# Patient Record
Sex: Female | Born: 1955 | Race: White | Hispanic: No | Smoking: Former smoker
Health system: Southern US, Community
[De-identification: ages and names within clinical notes are randomized; demographics above are authoritative.]

## PROBLEM LIST (undated history)

## (undated) DIAGNOSIS — C3490 Malignant neoplasm of unspecified part of unspecified bronchus or lung: Secondary | ICD-10-CM

## (undated) DIAGNOSIS — J449 Chronic obstructive pulmonary disease, unspecified: Secondary | ICD-10-CM

## (undated) DIAGNOSIS — K219 Gastro-esophageal reflux disease without esophagitis: Secondary | ICD-10-CM

## (undated) DIAGNOSIS — G709 Myoneural disorder, unspecified: Secondary | ICD-10-CM

## (undated) DIAGNOSIS — H04129 Dry eye syndrome of unspecified lacrimal gland: Secondary | ICD-10-CM

## (undated) DIAGNOSIS — I10 Essential (primary) hypertension: Secondary | ICD-10-CM

## (undated) DIAGNOSIS — T7840XA Allergy, unspecified, initial encounter: Secondary | ICD-10-CM

## (undated) DIAGNOSIS — J45909 Unspecified asthma, uncomplicated: Secondary | ICD-10-CM

## (undated) DIAGNOSIS — Z5189 Encounter for other specified aftercare: Secondary | ICD-10-CM

## (undated) HISTORY — DX: Allergy, unspecified, initial encounter: T78.40XA

## (undated) HISTORY — DX: Dry eye syndrome of unspecified lacrimal gland: H04.129

## (undated) HISTORY — DX: Gastro-esophageal reflux disease without esophagitis: K21.9

## (undated) HISTORY — DX: Encounter for other specified aftercare: Z51.89

## (undated) HISTORY — PX: HERNIA REPAIR: SHX51

## (undated) HISTORY — DX: Unspecified asthma, uncomplicated: J45.909

## (undated) HISTORY — PX: APPENDECTOMY: SHX54

## (undated) HISTORY — DX: Chronic obstructive pulmonary disease, unspecified: J44.9

## (undated) HISTORY — DX: Myoneural disorder, unspecified: G70.9

---

## 1995-07-27 HISTORY — PX: SMALL INTESTINE SURGERY: SHX150

## 1998-07-26 HISTORY — PX: CERVICAL FUSION: SHX112

## 2004-07-03 ENCOUNTER — Other Ambulatory Visit: Admission: RE | Admit: 2004-07-03 | Discharge: 2004-07-03 | Payer: Self-pay | Admitting: Family Medicine

## 2005-10-13 ENCOUNTER — Encounter: Admission: RE | Admit: 2005-10-13 | Discharge: 2005-10-13 | Payer: Self-pay | Admitting: Family Medicine

## 2006-01-03 ENCOUNTER — Other Ambulatory Visit: Admission: RE | Admit: 2006-01-03 | Discharge: 2006-01-03 | Payer: Self-pay | Admitting: Family Medicine

## 2007-01-16 ENCOUNTER — Other Ambulatory Visit: Admission: RE | Admit: 2007-01-16 | Discharge: 2007-01-16 | Payer: Self-pay | Admitting: Family Medicine

## 2008-03-08 ENCOUNTER — Other Ambulatory Visit: Admission: RE | Admit: 2008-03-08 | Discharge: 2008-03-08 | Payer: Self-pay | Admitting: Family Medicine

## 2008-04-22 ENCOUNTER — Other Ambulatory Visit: Admission: RE | Admit: 2008-04-22 | Discharge: 2008-04-22 | Payer: Self-pay | Admitting: Family Medicine

## 2009-03-10 ENCOUNTER — Other Ambulatory Visit: Admission: RE | Admit: 2009-03-10 | Discharge: 2009-03-10 | Payer: Self-pay | Admitting: Family Medicine

## 2009-07-26 DIAGNOSIS — C3490 Malignant neoplasm of unspecified part of unspecified bronchus or lung: Secondary | ICD-10-CM

## 2009-07-26 HISTORY — PX: PNEUMONECTOMY: SHX168

## 2009-07-26 HISTORY — DX: Malignant neoplasm of unspecified part of unspecified bronchus or lung: C34.90

## 2010-01-01 ENCOUNTER — Other Ambulatory Visit: Admission: RE | Admit: 2010-01-01 | Discharge: 2010-01-01 | Payer: Self-pay | Admitting: Obstetrics and Gynecology

## 2010-01-19 ENCOUNTER — Encounter: Admission: RE | Admit: 2010-01-19 | Discharge: 2010-01-19 | Payer: Self-pay | Admitting: Family Medicine

## 2010-01-29 ENCOUNTER — Ambulatory Visit: Payer: Self-pay | Admitting: Cardiothoracic Surgery

## 2010-02-02 ENCOUNTER — Encounter: Admission: RE | Admit: 2010-02-02 | Discharge: 2010-02-02 | Payer: Self-pay | Admitting: Cardiothoracic Surgery

## 2010-02-04 ENCOUNTER — Ambulatory Visit (HOSPITAL_COMMUNITY): Admission: RE | Admit: 2010-02-04 | Discharge: 2010-02-04 | Payer: Self-pay | Admitting: Cardiothoracic Surgery

## 2010-02-05 ENCOUNTER — Encounter: Payer: Self-pay | Admitting: Internal Medicine

## 2010-02-05 ENCOUNTER — Ambulatory Visit: Payer: Self-pay | Admitting: Internal Medicine

## 2010-02-05 DIAGNOSIS — N83209 Unspecified ovarian cyst, unspecified side: Secondary | ICD-10-CM | POA: Insufficient documentation

## 2010-02-05 DIAGNOSIS — I10 Essential (primary) hypertension: Secondary | ICD-10-CM | POA: Insufficient documentation

## 2010-02-05 DIAGNOSIS — J449 Chronic obstructive pulmonary disease, unspecified: Secondary | ICD-10-CM | POA: Insufficient documentation

## 2010-02-05 DIAGNOSIS — J984 Other disorders of lung: Secondary | ICD-10-CM | POA: Insufficient documentation

## 2010-02-05 DIAGNOSIS — J4489 Other specified chronic obstructive pulmonary disease: Secondary | ICD-10-CM | POA: Insufficient documentation

## 2010-02-05 DIAGNOSIS — I671 Cerebral aneurysm, nonruptured: Secondary | ICD-10-CM | POA: Insufficient documentation

## 2010-02-12 ENCOUNTER — Ambulatory Visit: Payer: Self-pay | Admitting: Cardiothoracic Surgery

## 2010-04-07 ENCOUNTER — Encounter: Admission: RE | Admit: 2010-04-07 | Discharge: 2010-04-07 | Payer: Self-pay | Admitting: Family Medicine

## 2010-04-15 ENCOUNTER — Encounter: Admission: RE | Admit: 2010-04-15 | Discharge: 2010-04-15 | Payer: Self-pay | Admitting: Diagnostic Neuroimaging

## 2010-07-26 HISTORY — PX: BRAIN SURGERY: SHX531

## 2010-08-15 ENCOUNTER — Encounter: Payer: Self-pay | Admitting: Family Medicine

## 2010-08-25 NOTE — Assessment & Plan Note (Signed)
Summary: cancel    Copy to:  Dr Tyrone Sage Primary Provider/Referring Provider:   Dr. Docia Chuck, [place], [place].    History of Present Illness: did my note in the preload part. see that  Allergies: 1)  ! Codeine 2)  ! Hydrocodone 3)  ! * Latex

## 2010-10-11 LAB — BLOOD GAS, ARTERIAL
Acid-Base Excess: 3 mmol/L — ABNORMAL HIGH (ref 0.0–2.0)
Bicarbonate: 26.4 mEq/L — ABNORMAL HIGH (ref 20.0–24.0)
Drawn by: 211791
FIO2: 0.21 %
O2 Saturation: 94.9 %
Patient temperature: 98.6
TCO2: 23 mmol/L (ref 0–100)
pCO2 arterial: 37.7 mmHg (ref 35.0–45.0)
pH, Arterial: 7.459 — ABNORMAL HIGH (ref 7.350–7.400)
pO2, Arterial: 70.5 mmHg — ABNORMAL LOW (ref 80.0–100.0)

## 2010-10-11 LAB — GLUCOSE, CAPILLARY: Glucose-Capillary: 100 mg/dL — ABNORMAL HIGH (ref 70–99)

## 2010-10-22 ENCOUNTER — Other Ambulatory Visit: Payer: Self-pay | Admitting: Dermatology

## 2010-12-08 NOTE — Consult Note (Signed)
NEW PATIENT CONSULTATION   Claire Hamilton, Claire Hamilton  DOB:  06/16/1956                                        January 29, 2010  CHART #:  [id number]   PRIMARY CARE PHYSICIAN:  Dr. Docia Chuck, [place], [place].   REASON FOR CONSULTATION:  Right lung mass.   HISTORY OF PRESENT ILLNESS:  The patient is a 55 year old female with  long history of smoking who fell in early June and fractured her right  clavicle.  [facility], which was  thought to be abnormal, though we do not have a copy of this film.  Because of this, she was referred to [facility] for a CT scan of  the chest.  This revealed a spiculated mass in the right upper lobe, 18  x 28 x 20 mm, suspicious for primary lung cancer with central  cavitation.  There was prominent precarinal mediastinal adenopathy.  No  hilar adenopathy and a fracture of the distal clavicle.  The patient is  referred to Thoracic Surgery for further evaluation of newly found lung  mass.  The patient denies any history of hemoptysis.  She denies any  significant wheezing.  She has been a long-term smoker for over 30  years, notes that she quit last Friday.  She smoked up to a pack and  half a day.   She denies any previous cardiac history.  Denies any history of  myocardial infarction.  She does have a history of hypertension.  Denies  diabetes.  Denies hyperlipidemia.  Denies previous stroke.  Denies  claudication.  Denies renal insufficiency.  She has been told that she  has mild COPD.  We have no documented previous pulmonary function  studies.   PAST SURGICAL HISTORY:  Previous surgery includes in 1997, she had a  horseback riding injury resulting in small bowel resection.  In 1999,  she had effusion through an anterior cervical approach of the cervical  vertebrae.  She has had a history of inguinal hernia repair, history of  appendectomy and ruptured appendix.   SOCIAL HISTORY:  The patient is  married.  She is unemployed.  She notes  that she drinks on almost a daily basis.   CURRENT MEDICATIONS:  1. Ambien 10 mg a day.  2. Metoprolol 25 a day.  3. Ventolin.  4. Vitamin D.  5. Calcium.  6. Spironolactone/hydrochlorothiazide 25/25 daily.  7. Ramipril 2.5 a day.  8. Acetaminophen/tramadol 325/37.5 twice a day p.r.n.  9. Clobetasol propionate 0.05%.   ALLERGIES:  Codeine, hydrocodone, and latex.  The latex allergy involved  irritation and fatigue during the long process of secondary wound  intention with her abdominal wound injury in 1997.   FAMILY HISTORY:  Negative for history of lung cancer or family members  with early vascular disease.   REVIEW OF SYSTEMS:  CARDIAC:  The patient denies chest pain.  Denies  resting shortness of breath.  Does have exertional shortness of breath.  Denies orthopnea, presyncope, syncope, or palpitations.  GENERAL:  She has had weight loss.  She notes that she had lost from 126  down to 119.  She denies hemoptysis.  Denies change in bowel habits.  Denies blood in her stool or urine.  Denies psychiatric history.  Does  get rashes with sling that was placed on her  arm.  Denies history of  gallstones.   PHYSICAL EXAMINATION:  The patient is a thin-appearing white female, in  no distress.  Blood pressure is 133/80, pulse is 81, respiratory rate is  18, and O2 sats 93% on room air.  She is 5 feet 2 inches tall, 119  pounds.  She has no carotid bruits.  She has distant breath sounds  bilaterally, but without wheezing.  She has no palpable cervical or  supraclavicular adenopathy.  She has good strength in both arms and  legs.  She has healed abdominal wound from previous laparotomy.  She has  no calf tenderness or swelling.   DIAGNOSTIC TESTS:  CT scan is reviewed and shows approximately 2-cm  spiculated mass in the right upper lobe suspicious for carcinoma and  enlarged precarinal lymph nodes.  No further radiographic studies have  been  performed.   IMPRESSION:  The patient with a long-term history of smoking who  presents with an incidental finding of a right upper lobe lung nodule  while being evaluated for right clavicle fracture.  I have reviewed  these findings with the patient and recommended we proceed with further  radiographic evaluation and pulmonary functions.  We discussed with her  to stop smoking, which she is dedicated to.  I have also recommended  that she stop alcohol intake with anticipation of surgery.  We have made  arrangements for a PET scan to evaluate both the primary lesion, but  particularly to evaluate the precarinal lymph nodes.  She will also have  a full set of pulmonary function studies with diffusion capacity and  also will obtain an MRI of the brain to rule out metastatic disease in  the brain.  The patient will come to the [facility] Thoracic Oncology [facility]  next week to review these findings.  If her pulmonary function studies  are reasonable and there is no involvement of mediastinal nodes, then  she could be considered for wedge resection and radiation seeds or right  upper lobectomy depending on her pulmonary function with a slightly  enlarged lymph node.  If this is positive on PET scan, then she may  require mediastinoscopy or EBUS prior to consideration of lobectomy.   Sheliah Plane, MD  Electronically Signed   EG/MEDQ  D:  01/29/2010  T:  01/30/2010  Job:  [id number]   cc:   Shade Flood, M.D.  Darrow Bussing, MD

## 2010-12-08 NOTE — Assessment & Plan Note (Signed)
OFFICE VISIT   Claire Hamilton, Claire Hamilton  DOB:  [birth year]                                        February 12, 2010  CHART #:  [id number]   HISTORY:  The patient returns to the office today for followup visit  after her initial consultation 2 weeks ago.  Since that time, pulmonary  function studies have been performed, MRI of the brain, and PET scan.  She has also seen Dr. Marchelle Gearing in pulmonary consultation.  Since last  seen, the patient has not smoked for the last 2 weeks.  She has noted  some improvement in her overall breathing since starting on Symbicort.   PHYSICAL EXAMINATION:  The patient's blood pressure is 157/100, repeat  was 150/90, pulse 82, respiratory rate is 18, and O2 sats 95%.  She is  not in any distress.  Her lungs are without wheezing.  I do not  appreciate any cervical or supraclavicular adenopathy.  The lower  extremities are without edema.   DIAGNOSTIC TESTS:  I have reviewed the MRI results, the pulmonary  function studies, and PET scan results with her.  The PET scan shows no  hilar adenopathy, fracture of the distal right clavicle.  There is no  other uptake.  Pulmonary function studies, she has surprisingly  significant improvement in her FEV1 from 1.65 to 2 with bronchodilator  treatment.  Screening PFTs done today confirming FEV1 of 2.  Her DLCO is  63%.  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   IMPRESSION:  After discussing the risks and options of surgical  treatment versus radiation, the patient also saw Dr. Michell Heinrich, who  discussed with her the radiotherapy treatment options with her recurrent  lung lesion.  I have recommended to her that we proceed with surgical  resection, at least a wedge resection, if not, lobectomy of the right  upper lobe.  She has for the past 2 weeks refrained from smoking and any  alcohol intake.  At this point, she is considering  her options and  if she wishes to proceed, she will discuss it further with her husband  and call back and make arrangements for surgery in the next 2-3 weeks.   Sheliah Plane, MD  Electronically Signed   EG/MEDQ  D:  02/12/2010  T:  02/13/2010  Job:  [id number]   cc:   Darrow Bussing, MD  Pearline Cables, MD

## 2011-01-04 ENCOUNTER — Other Ambulatory Visit (HOSPITAL_COMMUNITY)
Admission: RE | Admit: 2011-01-04 | Discharge: 2011-01-04 | Disposition: A | Discharge status: Home / Self Care | Payer: BC Managed Care – PPO | Source: Ambulatory Visit | Attending: Obstetrics and Gynecology | Admitting: Obstetrics and Gynecology

## 2011-01-04 ENCOUNTER — Other Ambulatory Visit: Payer: Self-pay | Admitting: Obstetrics and Gynecology

## 2011-01-04 DIAGNOSIS — Z01419 Encounter for gynecological examination (general) (routine) without abnormal findings: Secondary | ICD-10-CM | POA: Insufficient documentation

## 2011-06-02 ENCOUNTER — Other Ambulatory Visit: Payer: Self-pay | Admitting: Dermatology

## 2011-10-26 ENCOUNTER — Other Ambulatory Visit: Payer: Self-pay | Admitting: Family Medicine

## 2011-10-26 DIAGNOSIS — R1031 Right lower quadrant pain: Secondary | ICD-10-CM

## 2011-10-29 ENCOUNTER — Ambulatory Visit
Admission: RE | Admit: 2011-10-29 | Discharge: 2011-10-29 | Disposition: A | Discharge status: Home / Self Care | Payer: BC Managed Care – PPO | Source: Ambulatory Visit | Attending: Family Medicine | Admitting: Family Medicine

## 2011-10-29 DIAGNOSIS — R1031 Right lower quadrant pain: Secondary | ICD-10-CM

## 2011-10-29 MED ORDER — IOHEXOL 300 MG/ML  SOLN
100.0000 mL | Freq: Once | INTRAMUSCULAR | Status: AC | PRN
  Administered 2011-10-29: 100 mL via INTRAVENOUS

## 2012-01-04 ENCOUNTER — Other Ambulatory Visit (HOSPITAL_COMMUNITY)
Admission: RE | Admit: 2012-01-04 | Discharge: 2012-01-04 | Disposition: A | Discharge status: Home / Self Care | Payer: BC Managed Care – PPO | Source: Ambulatory Visit | Attending: Obstetrics and Gynecology | Admitting: Obstetrics and Gynecology

## 2012-01-04 ENCOUNTER — Other Ambulatory Visit: Payer: Self-pay | Admitting: Obstetrics and Gynecology

## 2012-01-04 DIAGNOSIS — Z01419 Encounter for gynecological examination (general) (routine) without abnormal findings: Secondary | ICD-10-CM | POA: Insufficient documentation

## 2012-01-06 ENCOUNTER — Other Ambulatory Visit: Payer: Self-pay | Admitting: Obstetrics and Gynecology

## 2012-01-06 DIAGNOSIS — E049 Nontoxic goiter, unspecified: Secondary | ICD-10-CM

## 2012-01-10 ENCOUNTER — Other Ambulatory Visit: Payer: BC Managed Care – PPO

## 2012-01-11 ENCOUNTER — Ambulatory Visit
Admission: RE | Admit: 2012-01-11 | Discharge: 2012-01-11 | Disposition: A | Discharge status: Home / Self Care | Payer: BC Managed Care – PPO | Source: Ambulatory Visit | Attending: Obstetrics and Gynecology | Admitting: Obstetrics and Gynecology

## 2012-01-11 DIAGNOSIS — E049 Nontoxic goiter, unspecified: Secondary | ICD-10-CM

## 2012-05-01 ENCOUNTER — Other Ambulatory Visit: Payer: Self-pay | Admitting: Family Medicine

## 2012-05-01 DIAGNOSIS — R7989 Other specified abnormal findings of blood chemistry: Secondary | ICD-10-CM

## 2012-05-03 ENCOUNTER — Ambulatory Visit
Admission: RE | Admit: 2012-05-03 | Discharge: 2012-05-03 | Disposition: A | Discharge status: Home / Self Care | Payer: BC Managed Care – PPO | Source: Ambulatory Visit | Attending: Family Medicine | Admitting: Family Medicine

## 2012-05-03 ENCOUNTER — Emergency Department (HOSPITAL_COMMUNITY)
Admission: EM | Admit: 2012-05-03 | Discharge: 2012-05-03 | Disposition: A | Discharge status: Home / Self Care | Payer: BC Managed Care – PPO | Attending: Emergency Medicine | Admitting: Emergency Medicine

## 2012-05-03 ENCOUNTER — Emergency Department (HOSPITAL_COMMUNITY): Payer: BC Managed Care – PPO

## 2012-05-03 VITALS — BP 78/50 | HR 75

## 2012-05-03 DIAGNOSIS — E860 Dehydration: Secondary | ICD-10-CM | POA: Insufficient documentation

## 2012-05-03 DIAGNOSIS — R55 Syncope and collapse: Secondary | ICD-10-CM

## 2012-05-03 DIAGNOSIS — R7989 Other specified abnormal findings of blood chemistry: Secondary | ICD-10-CM

## 2012-05-03 DIAGNOSIS — R1011 Right upper quadrant pain: Secondary | ICD-10-CM | POA: Insufficient documentation

## 2012-05-03 LAB — COMPREHENSIVE METABOLIC PANEL
Alkaline Phosphatase: 73 U/L (ref 39–117)
BUN: 13 mg/dL (ref 6–23)
Chloride: 88 mEq/L — ABNORMAL LOW (ref 96–112)
GFR calc Af Amer: >90 mL/min (ref >90)
GFR calc non Af Amer: >90 mL/min (ref >90)
Glucose, Bld: 88 mg/dL (ref 70–99)
Potassium: 3.5 mEq/L (ref 3.5–5.1)
Total Bilirubin: 0.5 mg/dL (ref 0.3–1.2)

## 2012-05-03 LAB — CBC WITH DIFFERENTIAL/PLATELET
HCT: 34.7 % — ABNORMAL LOW (ref 36.0–46.0)
Hemoglobin: 12.6 g/dL (ref 12.0–15.0)
Lymphs Abs: 1.1 10*3/uL (ref 0.7–4.0)
MCH: 35 pg — ABNORMAL HIGH (ref 26.0–34.0)
Monocytes Relative: 8 % (ref 3–12)
Neutro Abs: 6.8 10*3/uL (ref 1.7–7.7)
Neutrophils Relative %: 78 % — ABNORMAL HIGH (ref 43–77)
RBC: 3.6 MIL/uL — ABNORMAL LOW (ref 3.87–5.11)

## 2012-05-03 LAB — URINALYSIS, ROUTINE W REFLEX MICROSCOPIC
Ketones, ur: NEGATIVE mg/dL
Leukocytes, UA: NEGATIVE
Nitrite: NEGATIVE
Protein, ur: NEGATIVE mg/dL

## 2012-05-03 MED ORDER — SODIUM CHLORIDE 0.9 % IV SOLN
INTRAVENOUS | Status: DC
  Administered 2012-05-03 (×3): via INTRAVENOUS

## 2012-05-03 MED ORDER — SODIUM CHLORIDE 0.9 % IV SOLN
1000.0000 mL | INTRAVENOUS | Status: DC
  Administered 2012-05-03: 1000 mL via INTRAVENOUS

## 2012-05-03 MED ORDER — SODIUM CHLORIDE 0.9 % IV SOLN
1000.0000 mL | Freq: Once | INTRAVENOUS | Status: AC
  Administered 2012-05-03: 1000 mL via INTRAVENOUS

## 2012-05-03 MED ORDER — SODIUM CHLORIDE 0.9 % IV SOLN: INTRAVENOUS | Status: DC

## 2012-05-03 NOTE — ED Notes (Signed)
Pt up ad lib to bathroom with Rn at side. Pt returned to room and was tachypnea. Stating she still feels weak. Pt getting liter fluid bolus and eating meal tray. Will reassess prior to d/c after bolus and she has eaten.

## 2012-05-03 NOTE — ED Notes (Signed)
Per EMS pt was at [facility] for ultrasound for Rt upper quad pain.  Pt in bathroom bowel movement and urinating.  Pt had near syncopal episode.  Given 500ns by staff at [facility] imaging prior to ems arrival bc bp was 70/50.  22 left forearm.  EMS vitals, 80/50 120 cbg, ekg showed bundle branch block and pt not aware of hx.  Pt has history of lung cancer.  When moving pt onto stretcher in ED pt had sudden onset posterior left neck pain 10/10.  Pt alert oriented X4 answering questions appropriately and moving all extremities.

## 2012-05-03 NOTE — ED Provider Notes (Signed)
History     CSN: [id number]  Arrival date & time 05/03/12  1037   First MD Initiated Contact with Patient 05/03/12 1113      Chief Complaint  Patient presents with  . Near Syncope    (Consider location/radiation/quality/duration/timing/severity/associated sxs/prior treatment) HPI Comments: Claire Hamilton is a 56 y.o. Female who is at an imaging center today, went to the bathroom, sat down to have a bowel movement, and nearly passed out. She was found with a blood pressure of 70/50. The patient remained alert, was aware of her surroundings, and did not fall. She was at the imaging Center, to get an ultrasound, to evaluate for liver abnormalities. She had seen her PCP. Last week for routine testing, and was found to have transaminitis. She relates drinking vodka heavily. There's been no nausea, vomiting, diarrhea, blood loss, or abdominal pain. She is using her usual medications, as prescribed. Note that she does take a diuretic. She feels worse with standing, and that improves with rest.  The history is provided by the patient.    Past Medical History  Diagnosis Date  . Cancer 2011    No past surgical history on file.  No family history on file.  History  Substance Use Topics  . Smoking status: Former Smoker -- 2.0 packs/day for 40 years    Types: Cigarettes    Quit date: 05/03/2010  . Smokeless tobacco: Never Used  . Alcohol Use: Not on file    OB History    Grav Para Term Preterm Abortions TAB SAB Ect Mult Living                  Review of Systems  All other systems reviewed and are negative.    Allergies  Codeine; Hydrocodone; Latex; and Naproxen  Home Medications   Current Outpatient Rx  Name Route Sig Dispense Refill  . METOPROLOL SUCCINATE ER 25 MG PO TB24 Oral Take 25 mg by mouth daily.    Marland Kitchen RAMIPRIL 5 MG PO CAPS Oral Take 5 mg by mouth daily.    Marland Kitchen SPIRONOLACTONE-HCTZ 25-25 MG PO TABS Oral Take 1 tablet by mouth daily.      BP 120/62  Pulse 91   Temp 98.1 F (36.7 C) (Oral)  Resp 18  SpO2 100%  Physical Exam  Nursing note and vitals reviewed. Constitutional: She is oriented to person, place, and time. She appears well-developed and well-nourished.  HENT:  Head: Normocephalic and atraumatic.  Eyes: Conjunctivae normal and EOM are normal. Pupils are equal, round, and reactive to light. No scleral icterus.  Neck: Normal range of motion and phonation normal. Neck supple.  Cardiovascular: Normal rate, regular rhythm and intact distal pulses.   Pulmonary/Chest: Effort normal and breath sounds normal. She exhibits no tenderness.  Abdominal: Soft. She exhibits no distension. There is tenderness (Mild right upper quadrant tenderness). There is no guarding.  Musculoskeletal: Normal range of motion.  Neurological: She is alert and oriented to person, place, and time. She has normal strength. She exhibits normal muscle tone.  Skin: Skin is warm and dry.  Psychiatric: She has a normal mood and affect. Her behavior is normal. Judgment and thought content normal.    ED Course  Procedures (including critical care time) Emergency department treatment IV fluid bolus, and drip. Oral food and fluid. Ultrasound imaging ordered.  Transfer to CDU for treatment and further evaluation.    Date: 05/03/2012  Rate: 74  Rhythm: normal sinus rhythm  QRS Axis: normal  Intervals:  Normal QT  ST/T Wave abnormalities: normal  Conduction Disutrbances:right bundle branch block and left anterior fascicular block  Narrative Interpretation:   Old EKG Reviewed: none available      Labs Reviewed  CBC WITH DIFFERENTIAL - Abnormal; Notable for the following:    RBC 3.60 (*)     HCT 34.7 (*)     MCH 35.0 (*)     MCHC 36.3 (*)     Neutrophils Relative 78 (*)     All other components within normal limits  COMPREHENSIVE METABOLIC PANEL - Abnormal; Notable for the following:    Sodium 129 (*)     Chloride 88 (*)     AST 166 (*)     ALT 115 (*)     All  other components within normal limits  URINALYSIS, ROUTINE W REFLEX MICROSCOPIC  URINE CULTURE   No results found.   1. Near syncope   2. Dehydration       MDM  Near Syncope with likely volume dehydration, as cause; suspect an effect of her diuretic mAnd:edication. Doubt ACS, active blood loss. Doubt metabolic instability, serious bacterial infection or impending vascular collapse; the patient is stable for discharge.      Plan: Sent to CDU for IV hydration and reassessment.    Flint Melter, MD 05/03/12 2125

## 2012-05-03 NOTE — ED Notes (Signed)
Pt states that she did not pass out but felt like she would.  She was attempting to stand from toilet and was close to blacking out.  Staff helped into wheelchair. Pt co pain in the base of her neck 10/10 that started just as she was getting in ed.

## 2012-05-03 NOTE — ED Notes (Signed)
Husband Cindee Lame-- [phone]

## 2012-05-03 NOTE — ED Notes (Addendum)
MD Effie Shy notified of patient vital signs.  Discussed that patient will remain on acute side of ED at this time and will not be transported to CDU at this time.

## 2012-05-03 NOTE — ED Notes (Signed)
Pt states she was trying to have a bm this morning at the imaging center. States when she tried to get up she woud become syncopal.

## 2012-05-03 NOTE — ED Provider Notes (Signed)
CDU note:  Patient was at the referral imaging Center to have an ultrasound when she went to the bathroom to have a bowel movement and to pass her urine. While attempting to pass her bowels and urine, the patient had a" near syncopal episode close". The staff called EMS and the patient was found to have a blood pressure of 70/50. Here in the emergency department the patient was given IV fluids until the blood pressure was improved and then moved to the CDU unit. The patient continued to receive fluids. No examination changes were found. The urinalysis was negative. The comprehensive metabolic reveals a low sodium of 129, a chloride low at 88, and AST elevated at 166, and an ALT elevated at 1:15. The complete blood count revealed a white blood cell count of 8.6, RBC of 3.60, hemoglobin of 12.6, hematocrit of 37.4, platelets of 193,000. The ultrasound of the abdomen showed poor delineation of the aortic bifurcation and spleen secondary to bowel gas. There was fatty infiltration of the liver. The remainder of the ultrasound examination was within normal limits. The patient's orthostatic blood pressure and pulse were checked and was found to be non-orthostatic. The patient ambulated in the room without problem. It is safe at this time for the patient to be discharged home. The patient is given instructions to increase her fluids. And to refrain from the use of alcohol. She is to see her primary physician in the office as sone as possible. She is to return if any changes or problems.  Kathie Dike, [place] 05/03/12 1931

## 2012-05-03 NOTE — ED Notes (Signed)
Misty Stanley is family  [phone]

## 2012-05-03 NOTE — Progress Notes (Addendum)
Pt found in front  bathroom, very diaphoretic, skin pale. Taken to nursing area and placed on strecther, Dr. Benard Rink in to visit. See vitals as follows, Dr.'s office and pt to be sent to ER.  1005 EMS here, report given

## 2012-05-03 NOTE — ED Notes (Signed)
PT HAS ARRIVED IN CDU FROM ULTRASOUND 

## 2012-05-03 NOTE — ED Notes (Signed)
Pt remains alert and oriented, states symptoms are improved, IV fluids infusing, remains in trendelenburg position.

## 2012-05-03 NOTE — ED Notes (Signed)
At patient bedside starting IV fluids, pt states she feels like she is going to pass out, pt noted to become pale, IV fluids started, pt placed in trendelenburg position, vital signs checked.

## 2012-05-03 NOTE — Progress Notes (Signed)
cbg 129 at [facility] RN

## 2012-05-03 NOTE — ED Notes (Signed)
PT IS IN ULTRASOUND. WILL COME TO CDU AFTER COMPLETED

## 2012-05-04 LAB — URINE CULTURE: Colony Count: NO GROWTH

## 2012-05-04 NOTE — ED Provider Notes (Signed)
Medical screening examination/treatment/procedure(s) were performed by non-physician practitioner and as supervising physician I was immediately available for consultation/collaboration.   Glynn Octave, MD 05/04/12 [phone]

## 2012-09-22 ENCOUNTER — Ambulatory Visit: Payer: BC Managed Care – PPO

## 2012-09-22 ENCOUNTER — Ambulatory Visit (INDEPENDENT_AMBULATORY_CARE_PROVIDER_SITE_OTHER): Payer: BC Managed Care – PPO | Admitting: Family Medicine

## 2012-09-22 VITALS — BP 98/68 | HR 78 | Temp 97.5°F | Resp 16 | Ht 63.5 in | Wt 143.4 lb

## 2012-09-22 DIAGNOSIS — R109 Unspecified abdominal pain: Secondary | ICD-10-CM

## 2012-09-22 DIAGNOSIS — R195 Other fecal abnormalities: Secondary | ICD-10-CM

## 2012-09-22 DIAGNOSIS — D7589 Other specified diseases of blood and blood-forming organs: Secondary | ICD-10-CM

## 2012-09-22 LAB — POCT CBC
Granulocyte percent: 61.9 %G (ref 37–80)
HCT, POC: 47.7 % (ref 37.7–47.9)
MCH, POC: 32.8 pg — AB (ref 27–31.2)
MCV: 103.4 fL — AB (ref 80–97)
POC LYMPH PERCENT: 28.4 %L (ref 10–50)
RBC: 4.61 M/uL (ref 4.04–5.48)
WBC: 7.7 10*3/uL (ref 4.6–10.2)

## 2012-09-22 LAB — POCT URINALYSIS DIPSTICK
Bilirubin, UA: NEGATIVE
Ketones, UA: NEGATIVE
Leukocytes, UA: NEGATIVE
Nitrite, UA: NEGATIVE
Protein, UA: NEGATIVE
pH, UA: 7.5

## 2012-09-22 LAB — LIPASE: Lipase: 30 U/L (ref 0–75)

## 2012-09-22 LAB — AMYLASE: Amylase: 49 U/L (ref 0–105)

## 2012-09-22 MED ORDER — DICYCLOMINE HCL 20 MG PO TABS: 20.0000 mg | ORAL_TABLET | Freq: Four times a day (QID) | ORAL | Status: DC

## 2012-09-22 MED ORDER — TRAMADOL HCL 50 MG PO TABS: 50.0000 mg | ORAL_TABLET | Freq: Three times a day (TID) | ORAL | Status: DC | PRN
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
## 2012-09-22 NOTE — Patient Instructions (Addendum)
Use the bentyl as needed, and let me know if you are not better in the next few days.  If you get in trouble while you are out of town you should seek care.  Please eat a bland diet- mostly simple carbs such as toast, applesauce, rice, bananas.    If you develop fever, more severe pain, or any other concerning symptoms you could have diverticulitis or another more serious abdominal issue and you should be seen right away!  You can also call us if any concerns   Also purchase some OTC pro- biotics for your stomach.    Call your GI doctor and schedule an appt as soon as you can

## 2012-09-23 ENCOUNTER — Telehealth: Payer: Self-pay | Admitting: Family Medicine

## 2012-09-23 LAB — COMPREHENSIVE METABOLIC PANEL
ALT: 21 U/L (ref 0–35)
AST: 28 U/L (ref 0–37)
Alkaline Phosphatase: 61 U/L (ref 39–117)
Chloride: 97 mEq/L (ref 96–112)
Glucose, Bld: 94 mg/dL (ref 70–99)
Potassium: 4.6 mEq/L (ref 3.5–5.3)
Total Bilirubin: 0.6 mg/dL (ref 0.3–1.2)
Total Protein: 7.2 g/dL (ref 6.0–8.3)

## 2012-09-23 NOTE — Telephone Encounter (Signed)
Called to check on how she is doing- LMOM. Labs look ok- let us know if not continuing to improve.

## 2012-09-24 ENCOUNTER — Telehealth: Payer: Self-pay

## 2012-09-24 ENCOUNTER — Encounter: Payer: Self-pay | Admitting: Family Medicine

## 2012-09-24 NOTE — Telephone Encounter (Signed)
Called to check on how she is doing.  She is feeling a lot better, let her know that her pancreas and liver labs are fine

## 2012-09-24 NOTE — Telephone Encounter (Signed)
PATIENT REMOVED HER NECKLACE IN X-RAY ON Friday, Feb 28, all the charms on the necklace went flying across the room.  She is missing a cross with diamond in it.  CBN:  [phone]

## 2012-09-25 NOTE — Telephone Encounter (Signed)
There is no cross in xray. How is she feeling?

## 2012-09-27 NOTE — Telephone Encounter (Signed)
Called her again to see how she is feeling. She is much better today. I have advised her can not locate the cross for her.

## 2012-10-09 NOTE — Telephone Encounter (Signed)
The cross is in Dr copland cabinet in Dr office.

## 2012-10-09 NOTE — Telephone Encounter (Signed)
Left message for her to call back

## 2012-10-09 NOTE — Telephone Encounter (Signed)
Her cross pendant has been located Dr Patsy Lager has it for her in the back

## 2012-10-10 NOTE — Telephone Encounter (Signed)
Called pt and she stated she had already called back and p/up cross. Thanked [place] again.

## 2013-01-03 ENCOUNTER — Other Ambulatory Visit (HOSPITAL_COMMUNITY)
Admission: RE | Admit: 2013-01-03 | Discharge: 2013-01-03 | Disposition: A | Discharge status: Home / Self Care | Payer: BC Managed Care – PPO | Source: Ambulatory Visit | Attending: Obstetrics and Gynecology | Admitting: Obstetrics and Gynecology

## 2013-01-03 ENCOUNTER — Other Ambulatory Visit: Payer: Self-pay | Admitting: Obstetrics and Gynecology

## 2013-01-03 DIAGNOSIS — Z1151 Encounter for screening for human papillomavirus (HPV): Secondary | ICD-10-CM | POA: Insufficient documentation

## 2013-01-03 DIAGNOSIS — Z01419 Encounter for gynecological examination (general) (routine) without abnormal findings: Secondary | ICD-10-CM | POA: Insufficient documentation

## 2013-03-30 ENCOUNTER — Inpatient Hospital Stay (HOSPITAL_COMMUNITY)
Admission: EM | Admit: 2013-03-30 | Discharge: 2013-04-01 | DRG: 750 | Disposition: A | Discharge status: Home / Self Care | Payer: BC Managed Care – PPO | Attending: Internal Medicine | Admitting: Internal Medicine

## 2013-03-30 DIAGNOSIS — F10229 Alcohol dependence with intoxication, unspecified: Secondary | ICD-10-CM | POA: Diagnosis present

## 2013-03-30 DIAGNOSIS — F1010 Alcohol abuse, uncomplicated: Secondary | ICD-10-CM

## 2013-03-30 DIAGNOSIS — F10239 Alcohol dependence with withdrawal, unspecified: Principal | ICD-10-CM | POA: Diagnosis present

## 2013-03-30 DIAGNOSIS — F10939 Alcohol use, unspecified with withdrawal, unspecified: Principal | ICD-10-CM

## 2013-03-30 DIAGNOSIS — I671 Cerebral aneurysm, nonruptured: Secondary | ICD-10-CM

## 2013-03-30 DIAGNOSIS — J449 Chronic obstructive pulmonary disease, unspecified: Secondary | ICD-10-CM | POA: Diagnosis present

## 2013-03-30 DIAGNOSIS — I498 Other specified cardiac arrhythmias: Secondary | ICD-10-CM | POA: Diagnosis present

## 2013-03-30 DIAGNOSIS — G4700 Insomnia, unspecified: Secondary | ICD-10-CM | POA: Diagnosis present

## 2013-03-30 DIAGNOSIS — Z79899 Other long term (current) drug therapy: Secondary | ICD-10-CM

## 2013-03-30 DIAGNOSIS — K219 Gastro-esophageal reflux disease without esophagitis: Secondary | ICD-10-CM | POA: Diagnosis present

## 2013-03-30 DIAGNOSIS — Z7982 Long term (current) use of aspirin: Secondary | ICD-10-CM

## 2013-03-30 DIAGNOSIS — R112 Nausea with vomiting, unspecified: Secondary | ICD-10-CM

## 2013-03-30 DIAGNOSIS — E785 Hyperlipidemia, unspecified: Secondary | ICD-10-CM | POA: Diagnosis present

## 2013-03-30 DIAGNOSIS — Z87891 Personal history of nicotine dependence: Secondary | ICD-10-CM

## 2013-03-30 DIAGNOSIS — I10 Essential (primary) hypertension: Secondary | ICD-10-CM

## 2013-03-30 DIAGNOSIS — R000 Tachycardia, unspecified: Secondary | ICD-10-CM

## 2013-03-30 DIAGNOSIS — Z85118 Personal history of other malignant neoplasm of bronchus and lung: Secondary | ICD-10-CM

## 2013-03-30 DIAGNOSIS — J4489 Other specified chronic obstructive pulmonary disease: Secondary | ICD-10-CM

## 2013-03-30 DIAGNOSIS — F1020 Alcohol dependence, uncomplicated: Secondary | ICD-10-CM | POA: Diagnosis present

## 2013-03-30 HISTORY — DX: Malignant neoplasm of unspecified part of unspecified bronchus or lung: C34.90

## 2013-03-30 HISTORY — DX: Essential (primary) hypertension: I10

## 2013-03-30 LAB — COMPREHENSIVE METABOLIC PANEL
Albumin: 5.1 g/dL (ref 3.5–5.2)
BUN: 9 mg/dL (ref 6–23)
Calcium: 9.6 mg/dL (ref 8.4–10.5)
Chloride: 83 mEq/L — ABNORMAL LOW (ref 96–112)
Creatinine, Ser: 0.59 mg/dL (ref 0.50–1.10)
Total Bilirubin: 0.7 mg/dL (ref 0.3–1.2)

## 2013-03-30 LAB — CBC
HCT: 39.1 % (ref 36.0–46.0)
MCH: 34.9 pg — ABNORMAL HIGH (ref 26.0–34.0)
MCHC: 36.1 g/dL — ABNORMAL HIGH (ref 30.0–36.0)
MCV: 96.8 fL (ref 78.0–100.0)
RDW: 12.8 % (ref 11.5–15.5)

## 2013-03-30 LAB — RAPID URINE DRUG SCREEN, HOSP PERFORMED: Benzodiazepines: NOT DETECTED

## 2013-03-30 LAB — ETHANOL: Alcohol, Ethyl (B): 183 mg/dL — ABNORMAL HIGH (ref 0–11)

## 2013-03-30 LAB — SALICYLATE LEVEL: Salicylate Lvl: <2 mg/dL — ABNORMAL LOW (ref 2.8–20.0)

## 2013-03-30 MED ORDER — LORAZEPAM 2 MG/ML IJ SOLN
1.0000 mg | Freq: Once | INTRAMUSCULAR | Status: AC
  Administered 2013-03-30: 1 mg via INTRAVENOUS

## 2013-03-30 MED ORDER — ENOXAPARIN SODIUM 40 MG/0.4ML ~~LOC~~ SOLN
40.0000 mg | Freq: Every day | SUBCUTANEOUS | Status: DC
  Administered 2013-03-31 – 2013-04-01 (×2): 40 mg via SUBCUTANEOUS
  Filled 2013-03-30 (×2): qty 0.4

## 2013-03-30 MED ORDER — OMEGA-3 FATTY ACIDS 1000 MG PO CAPS: 1.0000 g | ORAL_CAPSULE | Freq: Every day | ORAL | Status: DC

## 2013-03-30 MED ORDER — ZOLPIDEM TARTRATE 5 MG PO TABS: 5.0000 mg | ORAL_TABLET | Freq: Every evening | ORAL | Status: DC | PRN

## 2013-03-30 MED ORDER — ONDANSETRON HCL 4 MG PO TABS: 4.0000 mg | ORAL_TABLET | Freq: Four times a day (QID) | ORAL | Status: DC | PRN

## 2013-03-30 MED ORDER — VITAMIN B-1 100 MG PO TABS
100.0000 mg | ORAL_TABLET | Freq: Every day | ORAL | Status: DC
  Administered 2013-03-30: 100 mg via ORAL
  Filled 2013-03-30: qty 1

## 2013-03-30 MED ORDER — PANTOPRAZOLE SODIUM 40 MG PO TBEC
40.0000 mg | DELAYED_RELEASE_TABLET | Freq: Every day | ORAL | Status: DC
  Administered 2013-03-30: 40 mg via ORAL
  Filled 2013-03-30: qty 1

## 2013-03-30 MED ORDER — VITAMIN B-1 100 MG PO TABS
100.0000 mg | ORAL_TABLET | Freq: Every day | ORAL | Status: DC
  Administered 2013-03-31 – 2013-04-01 (×2): 100 mg via ORAL
  Filled 2013-03-30 (×2): qty 1

## 2013-03-30 MED ORDER — HYPROMELLOSE (GONIOSCOPIC) 2.5 % OP SOLN: 1.0000 [drp] | Freq: Four times a day (QID) | OPHTHALMIC | Status: DC | PRN

## 2013-03-30 MED ORDER — PANTOPRAZOLE SODIUM 40 MG PO TBEC: 40.0000 mg | DELAYED_RELEASE_TABLET | ORAL | Status: DC | PRN

## 2013-03-30 MED ORDER — ONDANSETRON HCL 4 MG/2ML IJ SOLN
4.0000 mg | Freq: Once | INTRAMUSCULAR | Status: AC
  Administered 2013-03-30: 4 mg via INTRAVENOUS
  Filled 2013-03-30: qty 2

## 2013-03-30 MED ORDER — LORAZEPAM 2 MG/ML IJ SOLN: 1.0000 mg | Freq: Four times a day (QID) | INTRAMUSCULAR | Status: DC | PRN

## 2013-03-30 MED ORDER — LORAZEPAM 0.5 MG PO TABS: 1.0000 mg | ORAL_TABLET | Freq: Three times a day (TID) | ORAL | Status: DC | PRN

## 2013-03-30 MED ORDER — ALUM & MAG HYDROXIDE-SIMETH 200-200-20 MG/5ML PO SUSP: 30.0000 mL | Freq: Four times a day (QID) | ORAL | Status: DC | PRN

## 2013-03-30 MED ORDER — OMEPRAZOLE-SODIUM BICARBONATE 20-1680 MG PO PACK: 1.0000 | PACK | ORAL | Status: DC | PRN

## 2013-03-30 MED ORDER — OMEGA-3-ACID ETHYL ESTERS 1 G PO CAPS
1.0000 g | ORAL_CAPSULE | Freq: Every day | ORAL | Status: DC
  Administered 2013-03-30: 1 g via ORAL
  Filled 2013-03-30: qty 1

## 2013-03-30 MED ORDER — SPIRONOLACTONE-HCTZ 25-25 MG PO TABS: 1.0000 | ORAL_TABLET | Freq: Every day | ORAL | Status: DC

## 2013-03-30 MED ORDER — ALUM & MAG HYDROXIDE-SIMETH 200-200-20 MG/5ML PO SUSP: 30.0000 mL | ORAL | Status: DC | PRN

## 2013-03-30 MED ORDER — LORAZEPAM 1 MG PO TABS: 0.0000 mg | ORAL_TABLET | Freq: Two times a day (BID) | ORAL | Status: DC

## 2013-03-30 MED ORDER — THIAMINE HCL 100 MG/ML IJ SOLN
100.0000 mg | Freq: Every day | INTRAMUSCULAR | Status: DC
  Filled 2013-03-30 (×2): qty 1

## 2013-03-30 MED ORDER — LORAZEPAM 2 MG/ML IJ SOLN
1.0000 mg | Freq: Once | INTRAMUSCULAR | Status: AC
  Administered 2013-03-30: 1 mg via INTRAVENOUS
  Filled 2013-03-30: qty 1

## 2013-03-30 MED ORDER — ONDANSETRON HCL 4 MG/2ML IJ SOLN: 4.0000 mg | Freq: Four times a day (QID) | INTRAMUSCULAR | Status: DC | PRN

## 2013-03-30 MED ORDER — THIAMINE HCL 100 MG/ML IJ SOLN: 100.0000 mg | Freq: Every day | INTRAMUSCULAR | Status: DC

## 2013-03-30 MED ORDER — THIAMINE HCL 100 MG/ML IJ SOLN
Freq: Once | INTRAVENOUS | Status: AC
  Administered 2013-03-30: 19:00:00 via INTRAVENOUS
  Filled 2013-03-30: qty 1000

## 2013-03-30 MED ORDER — ADULT MULTIVITAMIN W/MINERALS CH
1.0000 | ORAL_TABLET | Freq: Every day | ORAL | Status: DC
  Administered 2013-03-31 – 2013-04-01 (×2): 1 via ORAL
  Filled 2013-03-30 (×2): qty 1

## 2013-03-30 MED ORDER — HYDROCHLOROTHIAZIDE 25 MG PO TABS: 25.0000 mg | ORAL_TABLET | Freq: Every day | ORAL | Status: DC

## 2013-03-30 MED ORDER — SPIRONOLACTONE 25 MG PO TABS: 25.0000 mg | ORAL_TABLET | Freq: Every day | ORAL | Status: DC

## 2013-03-30 MED ORDER — RAMIPRIL 5 MG PO CAPS
5.0000 mg | ORAL_CAPSULE | Freq: Every day | ORAL | Status: DC
  Administered 2013-03-30: 5 mg via ORAL
  Filled 2013-03-30 (×2): qty 1

## 2013-03-30 MED ORDER — HYDROMORPHONE HCL PF 1 MG/ML IJ SOLN: 0.5000 mg | INTRAMUSCULAR | Status: DC | PRN

## 2013-03-30 MED ORDER — ALBUTEROL SULFATE HFA 108 (90 BASE) MCG/ACT IN AERS: 2.0000 | INHALATION_SPRAY | Freq: Four times a day (QID) | RESPIRATORY_TRACT | Status: DC | PRN

## 2013-03-30 MED ORDER — SODIUM CHLORIDE 0.9 % IV BOLUS (SEPSIS)
1000.0000 mL | Freq: Once | INTRAVENOUS | Status: AC
  Administered 2013-03-30: 1000 mL via INTRAVENOUS

## 2013-03-30 MED ORDER — LORAZEPAM 1 MG PO TABS
0.0000 mg | ORAL_TABLET | Freq: Four times a day (QID) | ORAL | Status: DC
  Administered 2013-03-30: 1 mg via ORAL
  Administered 2013-03-30: 2 mg via ORAL
  Filled 2013-03-30 (×3): qty 1

## 2013-03-30 MED ORDER — OXYCODONE HCL 5 MG PO TABS: 5.0000 mg | ORAL_TABLET | ORAL | Status: DC | PRN

## 2013-03-30 MED ORDER — ONDANSETRON HCL 4 MG PO TABS
4.0000 mg | ORAL_TABLET | Freq: Three times a day (TID) | ORAL | Status: DC | PRN
  Administered 2013-03-30: 4 mg via ORAL
  Filled 2013-03-30: qty 1

## 2013-03-30 MED ORDER — LORAZEPAM 0.5 MG PO TABS: 1.0000 mg | ORAL_TABLET | Freq: Four times a day (QID) | ORAL | Status: DC | PRN

## 2013-03-30 MED ORDER — ADULT MULTIVITAMIN W/MINERALS CH
1.0000 | ORAL_TABLET | Freq: Every day | ORAL | Status: DC
  Administered 2013-03-30: 1 via ORAL
  Filled 2013-03-30: qty 1

## 2013-03-30 MED ORDER — SODIUM CHLORIDE 0.9 % IV SOLN
INTRAVENOUS | Status: DC
  Administered 2013-03-31: 21:00:00 via INTRAVENOUS
  Administered 2013-03-31: 999 mL via INTRAVENOUS
  Administered 2013-03-31 – 2013-04-01 (×3): via INTRAVENOUS

## 2013-03-30 MED ORDER — ASPIRIN EC 325 MG PO TBEC
325.0000 mg | DELAYED_RELEASE_TABLET | Freq: Every day | ORAL | Status: DC | PRN
  Filled 2013-03-30: qty 1

## 2013-03-30 MED ORDER — METOPROLOL SUCCINATE ER 25 MG PO TB24
25.0000 mg | ORAL_TABLET | Freq: Every day | ORAL | Status: DC
  Administered 2013-03-30: 25 mg via ORAL
  Filled 2013-03-30: qty 1

## 2013-03-30 MED ORDER — ACETAMINOPHEN 325 MG PO TABS: 650.0000 mg | ORAL_TABLET | Freq: Four times a day (QID) | ORAL | Status: DC | PRN

## 2013-03-30 MED ORDER — ACETAMINOPHEN 650 MG RE SUPP: 650.0000 mg | Freq: Four times a day (QID) | RECTAL | Status: DC | PRN

## 2013-03-30 MED ORDER — FOLIC ACID 1 MG PO TABS
1.0000 mg | ORAL_TABLET | Freq: Every day | ORAL | Status: DC
  Administered 2013-03-30: 1 mg via ORAL
  Filled 2013-03-30: qty 1

## 2013-03-30 MED ORDER — FOLIC ACID 1 MG PO TABS
1.0000 mg | ORAL_TABLET | Freq: Every day | ORAL | Status: DC
  Administered 2013-03-31 – 2013-04-01 (×2): 1 mg via ORAL
  Filled 2013-03-30 (×2): qty 1

## 2013-03-30 NOTE — ED Notes (Signed)
Pt presents with c/o feeling dehydrated and wanting help for drinking. Pt states she drinks about 8 drinks a day of vodka. Pt states she measures out her alcohol and its about 1.5 ounce a pour. Her last drink was around 10-11 pm last night. Pt states she is having increased confusion today and weakness that started yesterday. Pt denies any headache, nausea, or vomiting. Pt states she has been drinking since she was 57yrs old. Pt states an increase in drinking lately but does not give reason for sudden increase.

## 2013-03-30 NOTE — ED Notes (Signed)
Pt became nauseas and began vomiting during interview with ACT team - obvious tremors noted in hands.  Pt given PO Zofran and Ativan without relief- pt tachycardic.  Moved to room with tele monitoring, pt placed on continuous cardiac monitor and pulse ox.  IV in place- spoke with Patton Salles, PA about pt - received order for IV fluids and Ativan.  Pt alert and oriented, calm and cooperative at present- will continue to monitor pt.

## 2013-03-30 NOTE — BH Assessment (Signed)
[facility] Assessment Progress Note Received call from PA [place] requesting teleassessment for pt, as pt requesting help for alcohol detox.  Appointment made for 1700.

## 2013-03-30 NOTE — ED Provider Notes (Signed)
CSN: [id number]     Arrival date & time 03/30/13  1410 History   First MD Initiated Contact with Patient 03/30/13 1512     Chief Complaint  Patient presents with  . Medical Clearance   (Consider location/radiation/quality/duration/timing/severity/associated sxs/prior Treatment) The history is provided by the patient. No language interpreter was used.  Claire Hamilton is a 57 year old female with past medical history of cancer presenting to the emergency department with request for alcohol detox, patient presents to the emergency department with husband - stated "I guess I'm going through alcohol withdrawal." Patient reported that she's been drinking alcohol every single day, approximately 11-12 ounces of vodka per day since March 2014. Patient reported that which Dr. is her drink of choice. Patient reported that her last drink was 10-11:00 PM last night. Patient reported that she had at least 8-10 drinks of vodka. Patient reported that when she woke up this morning she felt weak, reported that she feels mildly dehydrated. Patient reported that she hasn't drank any water at all today. When asked regarding why patient started drinking patient could not answer this question, doesn't know what triggered the habit. Patient reported that her father was an alcoholic. When asked about depression she reported that "I don't feel depressed, I don't feel sad." Denied diarrhea, nausea, vomiting, abdominal pain, dizziness, headache, numbness, tingling, tremors, confusion, suicidal ideation, homicidal ideation, depression, self injury, chest pain, shortness of breath, difficulty breathing, sweating, palpitations. Denied smoking, illicit drug use, beer, wine. PCP-as per patient, reported that she is in between physicians; Dr. Jeannetta Nap versus [facility].  Past Medical History  Diagnosis Date  . Cancer 2011  . Allergy    Past Surgical History  Procedure Laterality Date  . Appendectomy    . Brain surgery    .  Hernia repair    . Small intestine surgery     Family History  Problem Relation Age of Onset  . Diabetes Mother   . Heart disease Mother   . Clotting disorder Mother   . Hypertension Mother   . Hypertension Sister    History  Substance Use Topics  . Smoking status: Former Smoker -- 2.00 packs/day for 40 years    Types: Cigarettes    Quit date: 05/03/2010  . Smokeless tobacco: Never Used  . Alcohol Use: No   OB History   Grav Para Term Preterm Abortions TAB SAB Ect Mult Living                 Review of Systems  Constitutional: Negative for fever and chills.  HENT: Negative for neck pain and neck stiffness.   Eyes: Negative for visual disturbance.  Respiratory: Negative for chest tightness and shortness of breath.   Cardiovascular: Negative for chest pain.  Gastrointestinal: Negative for nausea, vomiting, abdominal pain and diarrhea.  Genitourinary: Negative for decreased urine volume and difficulty urinating.  Neurological: Positive for weakness. Negative for dizziness, numbness and headaches.  All other systems reviewed and are negative.    Allergies  Amoxicillin; Codeine; Hydrocodone; Latex; and Naproxen  Home Medications   Current Outpatient Rx  Name  Route  Sig  Dispense  Refill  . albuterol (PROVENTIL HFA;VENTOLIN HFA) 108 (90 BASE) MCG/ACT inhaler   Inhalation   Inhale 2 puffs into the lungs every 6 (six) hours as needed for wheezing or shortness of breath.         Marland Kitchen aspirin EC 325 MG tablet   Oral   Take 325 mg by mouth daily as needed  for pain.         . Esomeprazole Magnesium (NEXIUM PO)   Oral   Take 1 capsule by mouth daily as needed (acid reflux).         . fish oil-omega-3 fatty acids 1000 MG capsule   Oral   Take 1 g by mouth daily.         . hydroxypropyl methylcellulose (ISOPTO TEARS) 2.5 % ophthalmic solution   Both Eyes   Place 1 drop into both eyes 4 (four) times daily as needed (dry eyes).         . metoprolol succinate  (TOPROL-XL) 25 MG 24 hr tablet   Oral   Take 25 mg by mouth daily.         . Multiple Vitamins-Minerals (ADULT GUMMY PO)   Oral   Take 1 each by mouth daily.         Maxwell Caul Bicarbonate (ZEGERID PO)   Oral   Take 1 capsule by mouth daily as needed (acid reflux).         . ramipril (ALTACE) 5 MG capsule   Oral   Take 5 mg by mouth daily.         Marland Kitchen spironolactone-hydrochlorothiazide (ALDACTAZIDE) 25-25 MG per tablet   Oral   Take 1 tablet by mouth daily.          BP 153/77  Pulse 105  Temp(Src) 98.1 F (36.7 C) (Oral)  Resp 21  SpO2 98% Physical Exam  Nursing note and vitals reviewed. Constitutional: She is oriented to person, place, and time. She appears well-developed and well-nourished. No distress.  HENT:  Head: Normocephalic and atraumatic.  Mouth/Throat: Oropharynx is clear and moist. No oropharyngeal exudate.  Negative findings the posterior oropharynx swelling, erythema, exudate. Negative signs of peritonsillar abscess. Uvula midline and symmetrical.   Eyes: Conjunctivae and EOM are normal. Pupils are equal, round, and reactive to light. Right eye exhibits no discharge. Left eye exhibits no discharge.  Negative nystagmus  Neck: Normal range of motion. Neck supple. No tracheal deviation present.  Negative neck stiffness Negative nuchal rigidity Negative pain upon palpation cervical spine Negative cervical lymphadenopathy  Cardiovascular: Normal rate and regular rhythm.  Exam reveals no friction rub.   No murmur heard. Pulmonary/Chest: Effort normal and breath sounds normal. No respiratory distress. She has no wheezes. She has no rales.  Abdominal: Soft. Bowel sounds are normal. She exhibits no distension. There is no tenderness. There is no rebound and no guarding.  Musculoskeletal: Normal range of motion.  Lymphadenopathy:    She has no cervical adenopathy.  Neurological: She is alert and oriented to person, place, and time. No cranial nerve  deficit. She exhibits normal muscle tone. Coordination normal.  Cranial nerves III through XII grossly intact Strength 5+/5+ upper and lower tremors bilaterally, with resistance, equal distribution identified Sensation intact upper lower extremities with differentiation to sharp and dull touch Mild shaking to the hands noted when patient extends her arms out in front of her, left worse than right  Skin: Skin is warm and dry. No rash noted. She is not diaphoretic. No erythema.  Psychiatric: She has a normal mood and affect. Her behavior is normal. Thought content normal.    ED Course  Procedures (including critical care time)  3:48 PM Dr. Alden Server to see and assess patient. Dr. Dub Mikes agreed for patient to be assess by TTS team regarding first time requesting detox. Patient currently in beginnings of alcohol withdrawal.  4:27  PM Spoke with Christa from behavioral health services, scheduled assessment with patient at approximately 5:00 PM for possible detox placement.  5:38 PM Spoke with nurse from psych ED - patient currently becoming tachycardic and shaking - currently going through withdrawls. IV saline, IV fluid, and IV Ativan given.   8:05 PM spoke with nurse regarding assessment. Nurse reported that she was assessed at approximately 5:00 PM this evening where patient was assessed, reported the patient became tachycardic with nausea and emesis, reported that assessment was stopped and patient was to be reassessed later on.  10:44 PM Patient remains tachycardic with heart rate ranging in 105-111.   11:20 PM Patient seen and assessed by this provider. Patient reported that she has been feeling nausea and reported that she has vomited at least 3 times while in ED setting. Reported that she developed a headache that has now decreased. Reported that she is having shakes to her hands. When arms extended out, patient has shaking to the hands. Patient is not in delirium tremens.  Continuously increasing and decreasing in heart rate. [facility] will not accept patient with elevated heart rate. Denied chest pain, shortness of breath, difficulty breathing.  Due to patient's increased heart rate [facility] will not accept the patient. Patient to be admitted to Internal Medicine Services for alcohol withdrawal.   11:30 PM Discussed case with Dr. Mort Sawyers, patient to be admitted to Internal Medicine Services, Step-down. Patient to be admited.    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  Narrative Interpretation:   Old EKG Reviewed: unchanged when compared to EKG from 05/03/2012    Labs Review Labs Reviewed  CBC - Abnormal; Notable for the following:    MCH 34.9 (*)    MCHC 36.1 (*)    All other components within normal limits  COMPREHENSIVE METABOLIC PANEL - Abnormal; Notable for the following:    Sodium 129 (*)    Chloride 83 (*)    AST 260 (*)    ALT 168 (*)    All other components within normal limits  ETHANOL - Abnormal; Notable for the following:    Alcohol, Ethyl (B) 183 (*)    All other components within normal limits  SALICYLATE LEVEL - Abnormal; Notable for the following:    Salicylate Lvl <2.0 (*)    All other components within normal limits  ACETAMINOPHEN LEVEL  URINE RAPID DRUG SCREEN (HOSP PERFORMED)   Imaging Review No results found.  MDM   1. Alcohol abuse   2. Lung cancer, unspecified laterality     Patient presenting to emergency department with request for detox from alcohol. Patient reported that she's been drinking alcohol for the past couple months, starting in March 2014-when asked what triggered the alcohol binge patient was unable to answer the question. Patient reported that her last drink was last night at approximately 10:00-11:00PM. Patient denied suicidal ideation,  homicidal ideation, depression, chest pain, shortness of breath, difficulty breathing, palpitations, sweating diarrhea, abdominal pain, vomiting, nausea. Alert and oriented. Negative neurological deficits identified. Cranial nerves II through XII grossly intact. Lungs patient bilaterally to upper and lower lobes. Heart rate and rhythm normal. Pulses palpable to the distal and proximal regions bilaterally. Strength intact. Sensation intact upper and lower extremities bilaterally. Shaking noted to the hands when patient keeps her arms extended, left more so than the right. Patient  currently not in alcohol withdrawal. Due to first detox request, TTS will be consult for assessment. Patient seen and assessed by Dr. Herminio Heads, cleared patient. Patient medically cleared completely and placed in psych ED. Psych orders placed. CIWA orders placed.  Patient to have placement at behavioral health Hospital due to tachycardia. Patient not currently in delirium tremens. Patient placed and admitted to internal medicine services, stepdown, for alcohol withdrawal and for monitoring. Discussed case with Dr. Lovell Sheehan, triad hospitalist. Patient admitted to internal medicine services.   Raymon Mutton, PA-C 04/01/13 [phone]

## 2013-03-30 NOTE — ED Notes (Signed)
Husband with patient. Pt ambulated tot he batroom by herself pt became shaky while drawing blood.

## 2013-03-30 NOTE — BH Assessment (Signed)
Tele Assessment Note   Claire Hamilton is an 57 y.o. female that was assessed via tele assessment this day after presenting to [facility] requesting detox from alcohol.  Pt's husband also present during assessment.  Upon assessment, pt cooperative and anxiety noted.  Pt was able to answer most of the questions, but then began vomiting and the assessment had to be ended.  Pt stated she was "tired of going on this way with the drinking," and decided to come in for detox.  Pt reports drinking 10 liquor drinks per day since the age of 73 and last drank 10 liquor drinks yesterday by report.  Pt reported she has never tried to detox herself before.  Pt denies any mental health sx at this time and has had no previous mental health treatment.  Pt denies SI, HI or psychosis.  Pt stated she has no previous attempts.  Pt motivated for detox and her husband is supportive.  Pt stated she has a hx of medical issues that have kept her out of work, including a brain aneurysm.  Completed tele assessment, updated ED nurse and will run at [facility] for possible inpatient detox from alcohol.  Axis I: 303.90 Alcohol Dependence Axis II: Deferred Axis III:  Past Medical History  Diagnosis Date  . Cancer 2011  . Allergy    Axis IV: other psychosocial or environmental problems Axis V: 31-40 impairment in reality testing  Past Medical History:  Past Medical History  Diagnosis Date  . Cancer 2011  . Allergy     Past Surgical History  Procedure Laterality Date  . Appendectomy    . Brain surgery    . Hernia repair    . Small intestine surgery      Family History:  Family History  Problem Relation Age of Onset  . Diabetes Mother   . Heart disease Mother   . Clotting disorder Mother   . Hypertension Mother   . Hypertension Sister     Social History:  reports that she quit smoking about 2 years ago. Her smoking use included Cigarettes. She has a 80 pack-year smoking history. She has never used smokeless tobacco. She  reports that she does not drink alcohol or use illicit drugs.  Additional Social History:  Alcohol / Drug Use Pain Medications: see MAR Prescriptions: see MAR Over the Counter: see MAR History of alcohol / drug use?: Yes Longest period of sobriety (when/how long): unknown Negative Consequences of Use:  (personal per pt) Withdrawal Symptoms: Nausea / Vomiting;Weakness;Patient aware of relationship between substance abuse and physical/medical complications Substance #1 Name of Substance 1: Alcohol 1 - Age of First Use: 20 1 - Amount (size/oz): 10 liquor drinks 1 - Frequency: daily 1 - Duration: ongoing 1 - Last Use / Amount: 03/29/13 - 10 liquor drinks  CIWA: CIWA-Ar BP: 159/79 mmHg Pulse Rate: 96 Nausea and Vomiting: 5 Tactile Disturbances: none Tremor: moderate, with patient's arms extended Auditory Disturbances: not present Paroxysmal Sweats: no sweat visible Visual Disturbances: not present Anxiety: no anxiety, at ease Headache, Fullness in Head: none present Agitation: normal activity Orientation and Clouding of Sensorium: oriented and can do serial additions CIWA-Ar Total: 9 COWS:    Allergies:  Allergies  Allergen Reactions  . Amoxicillin Nausea Only  . Codeine Nausea And Vomiting  . Hydrocodone Nausea And Vomiting  . Latex     REACTION: irritation, fatigue, delayed wound healing  . Naproxen Nausea And Vomiting    Home Medications:  (Not in a hospital admission)  OB/GYN Status:  No LMP recorded. Patient is postmenopausal.  General Assessment Data Location of Assessment: [facility] ED Is this a Tele or Face-to-Face Assessment?: Tele Assessment Is this an Initial Assessment or a Re-assessment for this encounter?: Initial Assessment Living Arrangements: Spouse/significant other Can pt return to current living arrangement?: Yes Admission Status: Voluntary Is patient capable of signing voluntary admission?: Yes Transfer from: Acute Hospital Referral Source:  Self/Family/Friend  Medical Screening Exam [facility] Walk-in ONLY) Medical Exam completed:  (na)  [facility] Crisis Care Plan Living Arrangements: Spouse/significant other Name of Psychiatrist: none Name of Therapist: none  Education Status Is patient currently in school?: No  Risk to self Suicidal Ideation: No Suicidal Intent: No Is patient at risk for suicide?: No Suicidal Plan?: No Access to Means: No What has been your use of drugs/alcohol within the last 12 months?: Pt admits to daily alcohol use Previous Attempts/Gestures: No How many times?: 0 Other Self Harm Risks: pt denies Triggers for Past Attempts: None known Intentional Self Injurious Behavior: Damaging Comment - Self Injurious Behavior: ongoing SA Family Suicide History: No Recent stressful life event(s): Other (Comment) (ongoing SA) Persecutory voices/beliefs?: No Depression: No Depression Symptoms:  (pt denies) Substance abuse history and/or treatment for substance abuse?: No Suicide prevention information given to non-admitted patients: Not applicable  Risk to Others Homicidal Ideation: No Thoughts of Harm to Others: No Current Homicidal Intent: No Current Homicidal Plan: No Access to Homicidal Means: No Identified Victim: pt denies History of harm to others?:  (UTA) Assessment of Violence: None Noted Violent Behavior Description: na - pt calm and cooperative Does patient have access to weapons?: No Criminal Charges Pending?: No Does patient have a court date: No  Psychosis Hallucinations: None noted Delusions: None noted  Mental Status Report Appear/Hygiene: Disheveled Eye Contact: Poor Motor Activity: Unsteady Speech: Logical/coherent;Soft;Slow Level of Consciousness: Alert Mood: Ashamed/humiliated;Sullen Affect: Sullen Anxiety Level: Moderate Thought Processes: Coherent;Relevant Judgement: Impaired Orientation: Person;Place;Time;Situation Obsessive Compulsive Thoughts/Behaviors:   (UTA)  Cognitive Functioning Concentration:  (UTA) Memory:  (UTA) IQ: Average Insight:  (UTA) Impulse Control: Poor Appetite:  (UTA) Weight Loss:  (Unknown) Weight Gain:  (Unknown) Sleep:  (UTA) Total Hours of Sleep:  (Unknown) Vegetative Symptoms:  (Unknown)  ADLScreening [facility] Assessment Services) Patient's cognitive ability adequate to safely complete daily activities?: Yes Patient able to express need for assistance with ADLs?: Yes Independently performs ADLs?: Yes (appropriate for developmental age)  Prior Inpatient Therapy Prior Inpatient Therapy: No Prior Therapy Dates: na Prior Therapy Facilty/Provider(s): na Reason for Treatment: na  Prior Outpatient Therapy Prior Outpatient Therapy: No Prior Therapy Dates: na Prior Therapy Facilty/Provider(s): na Reason for Treatment: na  ADL Screening (condition at time of admission) Patient's cognitive ability adequate to safely complete daily activities?: Yes Is the patient deaf or have difficulty hearing?: No Does the patient have difficulty seeing, even when wearing glasses/contacts?: No Does the patient have difficulty concentrating, remembering, or making decisions?: No Patient able to express need for assistance with ADLs?: Yes Does the patient have difficulty dressing or bathing?: No Independently performs ADLs?: Yes (appropriate for developmental age) Does the patient have difficulty walking or climbing stairs?: No  Home Assistive Devices/Equipment Home Assistive Devices/Equipment: None    Abuse/Neglect Assessment (Assessment to be complete while patient is alone) Physical Abuse:  (UTA) Verbal Abuse:  (UTA) Sexual Abuse:  (UTA) Exploitation of patient/patient's resources:  (UTA) Self-Neglect:  (UTA) Possible abuse reported to::  (UTA) Values / Beliefs Cultural Requests During Hospitalization: None Spiritual Requests During Hospitalization: None Consults Spiritual  Care Consult Needed: No Social Work Consult  Needed: No Merchant navy officer (For Healthcare) Advance Directive:  (pt unable to answer)    Additional Information 1:1 In Past 12 Months?: No CIRT Risk: No Elopement Risk: No Does patient have medical clearance?: Yes     Disposition:  Disposition Initial Assessment Completed for this Encounter: Yes Disposition of Patient: Referred to;Inpatient treatment program Type of inpatient treatment program: Adult Patient referred to: Other (Comment) (Pending [facility])  Caryl Comes 03/30/2013 5:48 PM

## 2013-03-30 NOTE — ED Notes (Signed)
Severe w/ drawal from etoh  Last drink yesterday  ( a lot she states) Evening  Wants help  Feels dehydrated denies vomiting

## 2013-03-30 NOTE — ED Notes (Signed)
Pt given paper scrubs and belongings placed in bag. Security called to wand pt.

## 2013-03-30 NOTE — ED Notes (Signed)
Pt has been wanded by security and belongings placed in belongings bag. Husband has jewelery and other valuables.

## 2013-03-30 NOTE — ED Provider Notes (Signed)
Patient complains of generalized weakness. She last drank alcohol yesterday evening. Drinks approximately 12 ounces of liquor per day. Exam alert Glasgow Coma Score 15 ambulatory without difficulty, mildly tremulous  Doug Sou, MD 03/30/13 1550

## 2013-03-31 ENCOUNTER — Encounter (HOSPITAL_COMMUNITY): Payer: Self-pay | Admitting: Internal Medicine

## 2013-03-31 DIAGNOSIS — R112 Nausea with vomiting, unspecified: Secondary | ICD-10-CM | POA: Diagnosis present

## 2013-03-31 DIAGNOSIS — F1020 Alcohol dependence, uncomplicated: Secondary | ICD-10-CM | POA: Diagnosis present

## 2013-03-31 DIAGNOSIS — F10239 Alcohol dependence with withdrawal, unspecified: Principal | ICD-10-CM | POA: Diagnosis present

## 2013-03-31 DIAGNOSIS — F1010 Alcohol abuse, uncomplicated: Secondary | ICD-10-CM

## 2013-03-31 DIAGNOSIS — C3490 Malignant neoplasm of unspecified part of unspecified bronchus or lung: Secondary | ICD-10-CM

## 2013-03-31 DIAGNOSIS — F10939 Alcohol use, unspecified with withdrawal, unspecified: Principal | ICD-10-CM | POA: Diagnosis present

## 2013-03-31 DIAGNOSIS — R000 Tachycardia, unspecified: Secondary | ICD-10-CM | POA: Diagnosis present

## 2013-03-31 DIAGNOSIS — I10 Essential (primary) hypertension: Secondary | ICD-10-CM

## 2013-03-31 DIAGNOSIS — Z85118 Personal history of other malignant neoplasm of bronchus and lung: Secondary | ICD-10-CM

## 2013-03-31 DIAGNOSIS — I498 Other specified cardiac arrhythmias: Secondary | ICD-10-CM

## 2013-03-31 LAB — BASIC METABOLIC PANEL
BUN: 9 mg/dL (ref 6–23)
CO2: 25 mEq/L (ref 19–32)
Chloride: 92 mEq/L — ABNORMAL LOW (ref 96–112)
GFR calc non Af Amer: >90 mL/min (ref >90)
Glucose, Bld: 86 mg/dL (ref 70–99)
Potassium: 3.5 mEq/L (ref 3.5–5.1)
Sodium: 130 mEq/L — ABNORMAL LOW (ref 135–145)

## 2013-03-31 LAB — CBC
HCT: 30.7 % — ABNORMAL LOW (ref 36.0–46.0)
Hemoglobin: 11 g/dL — ABNORMAL LOW (ref 12.0–15.0)
MCH: 34.7 pg — ABNORMAL HIGH (ref 26.0–34.0)
MCHC: 35.8 g/dL (ref 30.0–36.0)
MCV: 96.8 fL (ref 78.0–100.0)
RBC: 3.17 MIL/uL — ABNORMAL LOW (ref 3.87–5.11)

## 2013-03-31 MED ORDER — ASPIRIN EC 325 MG PO TBEC
325.0000 mg | DELAYED_RELEASE_TABLET | Freq: Every day | ORAL | Status: DC | PRN
  Filled 2013-03-31: qty 1

## 2013-03-31 MED ORDER — LORAZEPAM 2 MG/ML IJ SOLN: 1.0000 mg | Freq: Four times a day (QID) | INTRAMUSCULAR | Status: DC | PRN

## 2013-03-31 MED ORDER — CHLORDIAZEPOXIDE HCL 25 MG PO CAPS: 25.0000 mg | ORAL_CAPSULE | Freq: Four times a day (QID) | ORAL | Status: DC | PRN

## 2013-03-31 MED ORDER — LOPERAMIDE HCL 2 MG PO CAPS
2.0000 mg | ORAL_CAPSULE | ORAL | Status: DC | PRN
  Administered 2013-03-31: 2 mg via ORAL
  Filled 2013-03-31: qty 1

## 2013-03-31 MED ORDER — THIAMINE HCL 100 MG/ML IJ SOLN: 100.0000 mg | Freq: Every day | INTRAMUSCULAR | Status: DC

## 2013-03-31 MED ORDER — CHLORDIAZEPOXIDE HCL 25 MG PO CAPS: 25.0000 mg | ORAL_CAPSULE | Freq: Once | ORAL | Status: DC

## 2013-03-31 MED ORDER — ACETAMINOPHEN 325 MG PO TABS: 650.0000 mg | ORAL_TABLET | Freq: Four times a day (QID) | ORAL | Status: DC | PRN

## 2013-03-31 MED ORDER — METOPROLOL SUCCINATE ER 25 MG PO TB24
25.0000 mg | ORAL_TABLET | Freq: Every day | ORAL | Status: DC
  Administered 2013-03-31 – 2013-04-01 (×2): 25 mg via ORAL
  Filled 2013-03-31 (×2): qty 1

## 2013-03-31 MED ORDER — LOPERAMIDE HCL 2 MG PO CAPS: 2.0000 mg | ORAL_CAPSULE | ORAL | Status: DC | PRN

## 2013-03-31 MED ORDER — LORAZEPAM 2 MG/ML IJ SOLN: 0.0000 mg | Freq: Four times a day (QID) | INTRAMUSCULAR | Status: DC

## 2013-03-31 MED ORDER — RAMIPRIL 5 MG PO CAPS
5.0000 mg | ORAL_CAPSULE | Freq: Every day | ORAL | Status: DC
  Administered 2013-03-31 – 2013-04-01 (×2): 5 mg via ORAL
  Filled 2013-03-31 (×2): qty 1

## 2013-03-31 MED ORDER — LORAZEPAM 2 MG/ML IJ SOLN: 0.0000 mg | Freq: Two times a day (BID) | INTRAMUSCULAR | Status: DC

## 2013-03-31 MED ORDER — MAGNESIUM HYDROXIDE 400 MG/5ML PO SUSP
30.0000 mL | Freq: Every day | ORAL | Status: DC | PRN
  Filled 2013-03-31: qty 30

## 2013-03-31 MED ORDER — VITAMIN B-1 100 MG PO TABS: 100.0000 mg | ORAL_TABLET | Freq: Every day | ORAL | Status: DC

## 2013-03-31 MED ORDER — CHLORDIAZEPOXIDE HCL 25 MG PO CAPS: 25.0000 mg | ORAL_CAPSULE | Freq: Four times a day (QID) | ORAL | Status: DC

## 2013-03-31 MED ORDER — PANTOPRAZOLE SODIUM 40 MG PO TBEC
40.0000 mg | DELAYED_RELEASE_TABLET | Freq: Every day | ORAL | Status: DC
  Administered 2013-03-31 – 2013-04-01 (×2): 40 mg via ORAL
  Filled 2013-03-31 (×2): qty 1

## 2013-03-31 MED ORDER — CHLORDIAZEPOXIDE HCL 25 MG PO CAPS: 25.0000 mg | ORAL_CAPSULE | Freq: Three times a day (TID) | ORAL | Status: DC

## 2013-03-31 MED ORDER — ONDANSETRON 4 MG PO TBDP
4.0000 mg | ORAL_TABLET | Freq: Four times a day (QID) | ORAL | Status: DC | PRN
  Filled 2013-03-31: qty 1

## 2013-03-31 MED ORDER — CHLORDIAZEPOXIDE HCL 25 MG PO CAPS: 25.0000 mg | ORAL_CAPSULE | ORAL | Status: DC

## 2013-03-31 MED ORDER — CHLORDIAZEPOXIDE HCL 25 MG PO CAPS: 25.0000 mg | ORAL_CAPSULE | Freq: Every day | ORAL | Status: DC

## 2013-03-31 MED ORDER — MAGNESIUM HYDROXIDE 400 MG/5ML PO SUSP: 30.0000 mL | Freq: Every day | ORAL | Status: DC | PRN

## 2013-03-31 MED ORDER — ENSURE COMPLETE PO LIQD: 237.0000 mL | Freq: Two times a day (BID) | ORAL | Status: DC | PRN

## 2013-03-31 MED ORDER — ALBUTEROL SULFATE HFA 108 (90 BASE) MCG/ACT IN AERS: 2.0000 | INHALATION_SPRAY | Freq: Four times a day (QID) | RESPIRATORY_TRACT | Status: DC | PRN

## 2013-03-31 MED ORDER — HYDROXYZINE HCL 25 MG PO TABS
25.0000 mg | ORAL_TABLET | Freq: Four times a day (QID) | ORAL | Status: DC | PRN
  Filled 2013-03-31: qty 1

## 2013-03-31 MED ORDER — FOLIC ACID 1 MG PO TABS: 1.0000 mg | ORAL_TABLET | Freq: Every day | ORAL | Status: DC

## 2013-03-31 MED ORDER — LORAZEPAM 2 MG/ML IJ SOLN
2.0000 mg | Freq: Four times a day (QID) | INTRAMUSCULAR | Status: DC | PRN
  Administered 2013-04-01: 2 mg via INTRAVENOUS
  Filled 2013-03-31: qty 1

## 2013-03-31 MED ORDER — ADULT MULTIVITAMIN W/MINERALS CH: 1.0000 | ORAL_TABLET | Freq: Every day | ORAL | Status: DC

## 2013-03-31 MED ORDER — HYPROMELLOSE (GONIOSCOPIC) 2.5 % OP SOLN
1.0000 [drp] | Freq: Four times a day (QID) | OPHTHALMIC | Status: DC | PRN
Start: 2013-03-31 — End: 2013-04-01
  Filled 2013-03-31: qty 15

## 2013-03-31 MED ORDER — ONDANSETRON 4 MG PO TBDP: 4.0000 mg | ORAL_TABLET | Freq: Four times a day (QID) | ORAL | Status: DC | PRN

## 2013-03-31 MED ORDER — LORAZEPAM 0.5 MG PO TABS: 1.0000 mg | ORAL_TABLET | Freq: Four times a day (QID) | ORAL | Status: DC | PRN

## 2013-03-31 MED ORDER — HYDROCHLOROTHIAZIDE 25 MG PO TABS
25.0000 mg | ORAL_TABLET | Freq: Every day | ORAL | Status: DC
  Administered 2013-03-31 – 2013-04-01 (×2): 25 mg via ORAL
  Filled 2013-03-31 (×2): qty 1

## 2013-03-31 MED ORDER — OMEGA-3-ACID ETHYL ESTERS 1 G PO CAPS
1.0000 g | ORAL_CAPSULE | Freq: Every day | ORAL | Status: DC
  Administered 2013-03-31 – 2013-04-01 (×2): 1 g via ORAL
  Filled 2013-03-31 (×2): qty 1

## 2013-03-31 MED ORDER — ZOLPIDEM TARTRATE 5 MG PO TABS
5.0000 mg | ORAL_TABLET | Freq: Every evening | ORAL | Status: DC | PRN
  Administered 2013-03-31 (×2): 5 mg via ORAL
  Filled 2013-03-31 (×2): qty 1

## 2013-03-31 MED ORDER — LOPERAMIDE HCL 2 MG PO CAPS
2.0000 mg | ORAL_CAPSULE | ORAL | Status: DC | PRN
  Filled 2013-03-31: qty 2

## 2013-03-31 MED ORDER — SPIRONOLACTONE 25 MG PO TABS
25.0000 mg | ORAL_TABLET | Freq: Every day | ORAL | Status: DC
  Administered 2013-03-31 – 2013-04-01 (×2): 25 mg via ORAL
  Filled 2013-03-31 (×2): qty 1

## 2013-03-31 MED ORDER — HYDROXYZINE HCL 25 MG PO TABS: 25.0000 mg | ORAL_TABLET | Freq: Four times a day (QID) | ORAL | Status: DC | PRN

## 2013-03-31 NOTE — H&P (Signed)
[facility] History and Physical  [name] WGN:[id number] DOB: 1956-07-18 DOA: 03/30/2013  Referring physician: EDP PCP: No primary provider on file.  Specialists:   Chief Complaint: Wants to Stop Drinking  HPI: Claire Hamilton is a 57 y.o. female who presented to the ED to be admitted for Alcohol Detox but began to have nausea and vomiting and tachycardia during the ACT team evaluation and was deferred for medical clearance due to Alcohol Withdrawal.   She reports that her last drink was at 11 pm last night, and she regularly drinks 11 to 12 ounces of vodka daily and has done so since 09/2012.  She denies having any precipitating factors or life changes to cause her to drink.   She reports that she decided that she needed to quit because of her health.   She has a history of Lung Cancer  And underwent surgical resection in 2010 at [facility], and she reports not having to have chemotherapy or radiation treatments.  She reports that she now has yearly follow-up surveillance for her cancer.       Review of Systems: The patient denies anorexia, fever, chills, headaches, weight loss, vision loss, diplopia, dizziness, decreased hearing, rhinitis, hoarseness, chest pain, syncope, dyspnea on exertion, peripheral edema, balance deficits, cough, hemoptysis, abdominal pain, nausea, vomiting, diarrhea, constipation, hematemesis, melena, hematochezia, severe indigestion/heartburn, dysuria, hematuria, incontinence, muscle weakness, suspicious skin lesions, transient blindness, difficulty walking, depression, unusual weight change, abnormal bleeding, enlarged lymph nodes, angioedema, and breast masses.    Past Medical History  Diagnosis Date  . Lung cancer 2011  . Hypertension   . Allergy         COPD       Hyperlipidemia       GERD   Past Surgical History  Procedure Laterality Date  . Appendectomy    . Brain surgery    . Hernia repair    . Small intestine surgery    . Pneumonectomy       Prior to Admission medications   Medication Sig Start Date End Date Taking? Authorizing Provider  albuterol (PROVENTIL HFA;VENTOLIN HFA) 108 (90 BASE) MCG/ACT inhaler Inhale 2 puffs into the lungs every 6 (six) hours as needed for wheezing or shortness of breath.   Yes Historical Provider, MD  aspirin EC 325 MG tablet Take 325 mg by mouth daily as needed for pain.   Yes Historical Provider, MD  Esomeprazole Magnesium (NEXIUM PO) Take 1 capsule by mouth daily as needed (acid reflux).   Yes Historical Provider, MD  fish oil-omega-3 fatty acids 1000 MG capsule Take 1 g by mouth daily.   Yes Historical Provider, MD  hydroxypropyl methylcellulose (ISOPTO TEARS) 2.5 % ophthalmic solution Place 1 drop into both eyes 4 (four) times daily as needed (dry eyes).   Yes Historical Provider, MD  metoprolol succinate (TOPROL-XL) 25 MG 24 hr tablet Take 25 mg by mouth daily.   Yes Historical Provider, MD  Multiple Vitamins-Minerals (ADULT GUMMY PO) Take 1 each by mouth daily.   Yes Historical Provider, MD  Omeprazole-Sodium Bicarbonate (ZEGERID PO) Take 1 capsule by mouth daily as needed (acid reflux).   Yes Historical Provider, MD  ramipril (ALTACE) 5 MG capsule Take 5 mg by mouth daily.   Yes Historical Provider, MD  spironolactone-hydrochlorothiazide (ALDACTAZIDE) 25-25 MG per tablet Take 1 tablet by mouth daily.   Yes Historical Provider, MD    Allergies  Allergen Reactions  . Amoxicillin Nausea Only  . Codeine Nausea And Vomiting  . Hydrocodone  Nausea And Vomiting  . Latex     REACTION: irritation, fatigue, delayed wound healing  . Naproxen Nausea And Vomiting    Social History:  reports that she quit smoking about 2 years ago. Her smoking use included Cigarettes. She has a 80 pack-year smoking history. She has never used smokeless tobacco. She reports that she does not drink alcohol or use illicit drugs.     Family History  Problem Relation Age of Onset  . Diabetes Mother   . Heart disease  Mother   . Clotting disorder Mother   . Hypertension Mother   . Hypertension Sister        Physical Exam:  GEN:  Pleasant Tremulous Obese 57 y.o. Caucasian female  examined  and in no acute distress; cooperative with exam Filed Vitals:   03/30/13 2054 03/30/13 2200 03/30/13 2334 03/30/13 2345  BP: 153/85 153/77  141/78  Pulse: 111 105  95  Temp:      TempSrc:      Resp:  21  16  SpO2:  98% 98% 99%   Blood pressure 141/78, pulse 95, temperature 98.1 F (36.7 C), temperature source Oral, resp. rate 16, SpO2 99.00%. PSYCH: She is alert and oriented x4; does not appear anxious does not appear depressed; affect is normal HEENT: Normocephalic and Atraumatic, Mucous membranes pink; PERRLA; EOM intact; Fundi:  Benign;  No scleral icterus, Nares: Patent, Oropharynx: Clear, Fair Dentition, Neck:  FROM, no cervical lymphadenopathy nor thyromegaly or carotid bruit; no JVD; Breasts:: Not examined CHEST WALL: No tenderness CHEST: Normal respiration, clear to auscultation bilaterally HEART: Regular rate and rhythm; no murmurs rubs or gallops BACK: No kyphosis or scoliosis; no CVA tenderness ABDOMEN: Positive Bowel Sounds, Obese, soft non-tender; no masses, no organomegaly, no pannus; no intertriginous candida. Rectal Exam: Not done EXTREMITIES: No cyanosis, clubbing or edema; no ulcerations. Genitalia: not examined PULSES: 2+ and symmetric SKIN: Normal hydration no rash or ulceration CNS: Cranial nerves 2-12 grossly intact no focal neurologic deficit    Labs on Admission:  Basic Metabolic Panel:  Recent Labs Lab 03/30/13 1430  NA 129*  K 3.6  CL 83*  CO2 26  GLUCOSE 91  BUN 9  CREATININE 0.59  CALCIUM 9.6   Liver Function Tests:  Recent Labs Lab 03/30/13 1430  AST 260*  ALT 168*  ALKPHOS 84  BILITOT 0.7  PROT 8.3  ALBUMIN 5.1   No results found for this basename: LIPASE, AMYLASE,  in the last 168 hours No results found for this basename: AMMONIA,  in the last 168  hours CBC:  Recent Labs Lab 03/30/13 1430  WBC 5.3  HGB 14.1  HCT 39.1  MCV 96.8  PLT 236   Cardiac Enzymes: No results found for this basename: CKTOTAL, CKMB, CKMBINDEX, TROPONINI,  in the last 168 hours  BNP (last 3 results) No results found for this basename: PROBNP,  in the last 8760 hours CBG: No results found for this basename: GLUCAP,  in the last 168 hours  Radiological Exams on Admission: No results found.   EKG: Independently reviewed.  Sinus Rhythm with RBBB, No Acute ST changes.   Assessment/Plan Principal Problem:   Alcohol withdrawal Active Problems:   Sinus tachycardia   Nausea and vomiting   HYPERTENSION   Alcoholism   History of lung cancer      1.    Alcohol Withdrawal -  Admit to Stepdown Bed, placed on IV Ativan CIWA Protocol  2.    Alcoholism- Once medically  clear, Evaluation by Psychiatry/[facility] for Alcohol Rehab therapy.    3.   Sinus Tachycardia- due to Withdrawal, CIWA Protocol ordered and IVFs for rehydration.    4.    HTN- Monitor, Clonidine PRN.  Continue Toprol which she is on, and Ramipril, and Aldactizide.     5.    Hyperlipidemia- continue Fish oil Omega 3,  And check fasting Lipids in AM.     6.    GERD-  Protonix daily.     7.    Nausea and Vomiting - Due to #1, Anti- Emetics PRN, and PRN IV Ativan.     8.    Lung Cancer - Request Medical Records [facility].     9.    COPD -Stable - PRN Albuterol inhaler.    10.  Lovenox for DVT prophylaxis.        Code Status:   FULL CODE Family Communication:    No Family Present Disposition Plan:       Inpatient  Time spent:   48  Minutes  Ron Parker [facility] Pager [phone]  If 7PM-7AM, please contact night-coverage [email or web address] Password [email or web address] 03/31/2013, 1:10 AM

## 2013-03-31 NOTE — Progress Notes (Signed)
INITIAL NUTRITION ASSESSMENT  DOCUMENTATION CODES Per approved criteria  -Not Applicable   INTERVENTION: Provide Ensure Complete BID as needed Continue Multivitamin with minerals daily  NUTRITION DIAGNOSIS: Predicted suboptimal energy intake related to alcohol withdrawal as evidenced by pt's chart.   Goal: Pt to meet >/= 90% of their estimated nutrition needs   Monitor:  PO intake Weight Labs  Reason for Assessment: Malnutrition Screening Tool, score of 2  57 y.o. female  Admitting Dx: Alcohol withdrawal  ASSESSMENT: 57 y.o. female who presented to the ED to be admitted for Alcohol Detox but began to have nausea and vomiting and tachycardia during the ACT team evaluation and was deferred for medical clearance due to Alcohol Withdrawal. She reports that her last drink was at 11 pm last night, and she regularly drinks 11 to 12 ounces of vodka daily and has done so since 09/2012.   Pt asleep and unresponsive at time of visit. Per MD note nausea and vomiting resolved this morning; Shaking has also improved. Per weight history pt has gained 8 lbs in the past few months.  Height: Ht Readings from Last 1 Encounters:  03/31/13 5\' 3"  (1.6 m)    Weight: Wt Readings from Last 1 Encounters:  03/31/13 151 lb 14.4 oz (68.9 kg)    Ideal Body Weight: 115 lbs  % Ideal Body Weight: 131%  Wt Readings from Last 10 Encounters:  03/31/13 151 lb 14.4 oz (68.9 kg)  09/22/12 143 lb 6.4 oz (65.046 kg)    Usual Body Weight: unknown  % Usual Body Weight: NA  BMI:  Body mass index is 26.91 kg/(m^2).  Estimated Nutritional Needs: Kcal: 1700-1900 Protein: 70-80 grams Fluid: 2.2 L/day  Skin: WDL  Diet Order: Cardiac  EDUCATION NEEDS: -No education needs identified at this time   Intake/Output Summary (Last 24 hours) at 03/31/13 1227 Last data filed at 03/31/13 0800  Gross per 24 hour  Intake 501.67 ml  Output      0 ml  Net 501.67 ml    Last BM: PTA  Labs:   Recent  Labs Lab 03/30/13 1430 03/31/13 0430  NA 129* 130*  K 3.6 3.5  CL 83* 92*  CO2 26 25  BUN 9 9  CREATININE 0.59 0.61  CALCIUM 9.6 8.1*  GLUCOSE 91 86    CBG (last 3)  No results found for this basename: GLUCAP,  in the last 72 hours  Scheduled Meds: . enoxaparin (LOVENOX) injection  40 mg Subcutaneous Daily  . folic acid  1 mg Oral Daily  . hydrochlorothiazide  25 mg Oral Daily  . LORazepam  0-4 mg Oral Q6H   Followed by  . [START ON 04/01/2013] LORazepam  0-4 mg Oral Q12H  . metoprolol succinate  25 mg Oral Daily  . multivitamin with minerals  1 tablet Oral Daily  . omega-3 acid ethyl esters  1 g Oral Daily  . pantoprazole  40 mg Oral Daily  . ramipril  5 mg Oral Daily  . spironolactone  25 mg Oral Daily  . thiamine  100 mg Oral Daily   Or  . thiamine  100 mg Intravenous Daily    Continuous Infusions: . sodium chloride 100 mL/hr at 03/31/13 1223    Past Medical History  Diagnosis Date  . Lung cancer 2011  . Hypertension   . Allergy     Past Surgical History  Procedure Laterality Date  . Appendectomy    . Brain surgery    . Hernia repair    .  Small intestine surgery    . Pneumonectomy      Ian Malkin RD, LDN Inpatient Clinical Dietitian Pager: [phone] After Hours Pager: [phone]
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
## 2013-03-31 NOTE — Progress Notes (Signed)
[facility] PROGRESS NOTE  Claire Hamilton YNW:[id number] DOB: [birth year] DOA: 03/30/2013 PCP: No primary provider on file.  Assessment/Plan: 1. Alcohol Withdrawal - Continued on IV Ativan CIWA Protocol - shaking, HR, RR all much improved this AM 2. Alcoholism- - Pt is medically cleared for detox - Have called Behavioral Health for re-eval  3. Sinus Tachycardia - due to Withdrawal, CIWA Protocol ordered and IVFs for rehydration. - HR this AM staying just under 100 4. HTN- Monitor, Clonidine PRN. Continue Toprol which she is on, and Ramipril, and Aldactizide.  5. Hyperlipidemia- continue Fish oil Omega 3 6. GERD- Protonix daily.  7. Nausea and Vomiting  - Due to #1, resolved as of this AM 8. Lung Cancer - Medical Records form [facility] requested, pending. No acute pulmonary issues. Would defer to outpt mgt and w/u. 9. COPD -Stable - PRN Albuterol inhaler.  10. Lovenox for DVT prophylaxis.   Code Status: Full Family Communication: Pt in room (indicate person spoken with, relationship, and if by phone, the number) Disposition Plan: Pending   Consultants:  Psychiatry  HPI/Subjective: Feels much improved this AM. No n/v. No shaking.  Objective: Filed Vitals:   03/31/13 0500 03/31/13 0700 03/31/13 0800 03/31/13 1000  BP: 148/74 138/73 157/87 139/68  Pulse:  80 89 99  Temp:      TempSrc:      Resp:  16 15 33  Height:      Weight:      SpO2:  97% 99%     Intake/Output Summary (Last 24 hours) at 03/31/13 1149 Last data filed at 03/31/13 0800  Gross per 24 hour  Intake 501.67 ml  Output      0 ml  Net 501.67 ml   Filed Weights   03/31/13 0045  Weight: 68.9 kg (151 lb 14.4 oz)    Exam:   General:  Awake, in nad  Cardiovascular: regular, s1, s2  Respiratory: normal resp effort, no wheezing  Abdomen: soft, nondistended  Musculoskeletal: perfused, no clubbing   Data Reviewed: Basic Metabolic Panel:  Recent Labs Lab 03/30/13 1430 03/31/13 0430  NA 129*  130*  K 3.6 3.5  CL 83* 92*  CO2 26 25  GLUCOSE 91 86  BUN 9 9  CREATININE 0.59 0.61  CALCIUM 9.6 8.1*   Liver Function Tests:  Recent Labs Lab 03/30/13 1430  AST 260*  ALT 168*  ALKPHOS 84  BILITOT 0.7  PROT 8.3  ALBUMIN 5.1   No results found for this basename: LIPASE, AMYLASE,  in the last 168 hours No results found for this basename: AMMONIA,  in the last 168 hours CBC:  Recent Labs Lab 03/30/13 1430 03/31/13 0430  WBC 5.3 5.5  HGB 14.1 11.0*  HCT 39.1 30.7*  MCV 96.8 96.8  PLT 236 174   Cardiac Enzymes: No results found for this basename: CKTOTAL, CKMB, CKMBINDEX, TROPONINI,  in the last 168 hours BNP (last 3 results) No results found for this basename: PROBNP,  in the last 8760 hours CBG: No results found for this basename: GLUCAP,  in the last 168 hours  Recent Results (from the past 240 hour(s))  MRSA PCR SCREENING     Status: None   Collection Time    03/31/13  1:08 AM      Result Value Range Status   MRSA by PCR NEGATIVE  NEGATIVE Final   Comment:            The GeneXpert MRSA Assay (FDA     approved  for NASAL specimens     only), is one component of a     comprehensive MRSA colonization     surveillance program. It is not     intended to diagnose MRSA     infection nor to guide or     monitor treatment for     MRSA infections.     Studies: No results found.  Scheduled Meds: . enoxaparin (LOVENOX) injection  40 mg Subcutaneous Daily  . folic acid  1 mg Oral Daily  . hydrochlorothiazide  25 mg Oral Daily  . LORazepam  0-4 mg Oral Q6H   Followed by  . [START ON 04/01/2013] LORazepam  0-4 mg Oral Q12H  . metoprolol succinate  25 mg Oral Daily  . multivitamin with minerals  1 tablet Oral Daily  . omega-3 acid ethyl esters  1 g Oral Daily  . pantoprazole  40 mg Oral Daily  . ramipril  5 mg Oral Daily  . spironolactone  25 mg Oral Daily  . thiamine  100 mg Oral Daily   Or  . thiamine  100 mg Intravenous Daily   Continuous Infusions: .  sodium chloride 999 mL (03/31/13 0259)    Principal Problem:   Alcohol withdrawal Active Problems:   HYPERTENSION   Alcoholism   Sinus tachycardia   Nausea and vomiting   History of lung cancer    Time spent:    CHIU, STEPHEN K  [facility] Pager [phone]. If 7PM-7AM, please contact night-coverage at [email or web address], password [facility] 03/31/2013, 11:49 AM  LOS: 1 day

## 2013-04-01 DIAGNOSIS — I671 Cerebral aneurysm, nonruptured: Secondary | ICD-10-CM

## 2013-04-01 DIAGNOSIS — F1020 Alcohol dependence, uncomplicated: Secondary | ICD-10-CM

## 2013-04-01 NOTE — Discharge Summary (Signed)
Physician Discharge Summary  Claire Hamilton ZOX:[id number] DOB: [birth year] DOA: 03/30/2013  PCP: No primary provider on file.  Admit date: 03/30/2013 Discharge date: 04/01/2013  Time spent: 35 minutes  Recommendations for Outpatient Follow-up:  1. Follow up with PCP in 1-2 weeks 2. Follow up with Behavioral Health as scheduled  Discharge Diagnoses:  Principal Problem:   Alcohol withdrawal Active Problems:   HYPERTENSION   Alcoholism   Sinus tachycardia   Nausea and vomiting   History of lung cancer   Discharge Condition: Improved  Diet recommendation: Regular  Filed Weights   03/31/13 0045  Weight: 68.9 kg (151 lb 14.4 oz)    History of present illness:  Claire Hamilton is a 57 y.o. female who presented to the ED to be admitted for Alcohol Detox but began to have nausea and vomiting and tachycardia during the ACT team evaluation and was deferred for medical clearance due to Alcohol Withdrawal. She reports that her last drink was at 11 pm last night, and she regularly drinks 11 to 12 ounces of vodka daily and has done so since 09/2012. She denies having any precipitating factors or life changes to cause her to drink. She reports that she decided that she needed to quit because of her health. She has a history of Lung Cancer And underwent surgical resection in 2010 at [facility], and she reports not having to have chemotherapy or radiation treatments. She reports that she now has yearly follow-up surveillance for her cancer.   Hospital Course:  The patient was admitted to stepdown, requiring ativan overnight for acute withdrawal. The pateint's symptoms improved by the following morning. Psychiatry was consulted with recommendations for outpatient follow up. The patient otherwise remained medically stable for discharge.  Consultations:  Psychiatry  Discharge Exam: Filed Vitals:   04/01/13 0500 04/01/13 0520 04/01/13 0825 04/01/13 1132  BP: 166/91 159/89 146/77 116/60  Pulse: 74 77  75 80  Temp:   98.2 F (36.8 C) 98.6 F (37 C)  TempSrc:   Oral Oral  Resp: 16 18  15   Height:      Weight:      SpO2: 97% 97%      General: Awake, in nad Cardiovascular: regular, s1, s2 Respiratory: normal resp effort, no wheezing  Discharge Instructions     Medication List    STOP taking these medications       eszopiclone 3 MG Tabs  Generic drug:  Eszopiclone      TAKE these medications       ADULT GUMMY PO  Take 1 each by mouth daily.     albuterol 108 (90 BASE) MCG/ACT inhaler  Commonly known as:  PROVENTIL HFA;VENTOLIN HFA  Inhale 2 puffs into the lungs every 6 (six) hours as needed for wheezing or shortness of breath.     aspirin EC 325 MG tablet  Take 325 mg by mouth daily as needed for pain.     fish oil-omega-3 fatty acids 1000 MG capsule  Take 1 g by mouth daily.     hydroxypropyl methylcellulose 2.5 % ophthalmic solution  Commonly known as:  ISOPTO TEARS  Place 1 drop into both eyes 4 (four) times daily as needed (dry eyes).     metoprolol succinate 25 MG 24 hr tablet  Commonly known as:  TOPROL-XL  Take 25 mg by mouth daily.     NEXIUM PO  Take 1 capsule by mouth daily as needed (acid reflux).     ramipril 5 MG capsule  Commonly  known as:  ALTACE  Take 5 mg by mouth daily.     spironolactone-hydrochlorothiazide 25-25 MG per tablet  Commonly known as:  ALDACTAZIDE  Take 1 tablet by mouth daily.     ZEGERID PO  Take 1 capsule by mouth daily as needed (acid reflux).       Allergies  Allergen Reactions  . Amoxicillin Nausea Only  . Codeine Nausea And Vomiting  . Hydrocodone Nausea And Vomiting  . Latex     REACTION: irritation, fatigue, delayed wound healing  . Naproxen Nausea And Vomiting   Follow-up Information   Follow up with follow up with PCP in 1-2 weeks.      Follow up with Follow up with Psychiatry as an outpatient.       The results of significant diagnostics from this hospitalization (including imaging,  microbiology, ancillary and laboratory) are listed below for reference.    Significant Diagnostic Studies: No results found.  Microbiology: Recent Results (from the past 240 hour(s))  MRSA PCR SCREENING     Status: None   Collection Time    03/31/13  1:08 AM      Result Value Range Status   MRSA by PCR NEGATIVE  NEGATIVE Final   Comment:            The GeneXpert MRSA Assay (FDA     approved for NASAL specimens     only), is one component of a     comprehensive MRSA colonization     surveillance program. It is not     intended to diagnose MRSA     infection nor to guide or     monitor treatment for     MRSA infections.     Labs: Basic Metabolic Panel:  Recent Labs Lab 03/30/13 1430 03/31/13 0430  NA 129* 130*  K 3.6 3.5  CL 83* 92*  CO2 26 25  GLUCOSE 91 86  BUN 9 9  CREATININE 0.59 0.61  CALCIUM 9.6 8.1*   Liver Function Tests:  Recent Labs Lab 03/30/13 1430  AST 260*  ALT 168*  ALKPHOS 84  BILITOT 0.7  PROT 8.3  ALBUMIN 5.1   No results found for this basename: LIPASE, AMYLASE,  in the last 168 hours No results found for this basename: AMMONIA,  in the last 168 hours CBC:  Recent Labs Lab 03/30/13 1430 03/31/13 0430  WBC 5.3 5.5  HGB 14.1 11.0*  HCT 39.1 30.7*  MCV 96.8 96.8  PLT 236 174   Cardiac Enzymes: No results found for this basename: CKTOTAL, CKMB, CKMBINDEX, TROPONINI,  in the last 168 hours BNP: BNP (last 3 results) No results found for this basename: PROBNP,  in the last 8760 hours CBG: No results found for this basename: GLUCAP,  in the last 168 hours     Signed:  Fionnuala Hemmerich K  [facility] 04/01/2013, 4:37 PM

## 2013-04-01 NOTE — ED Provider Notes (Signed)
Medical screening examination/treatment/procedure(s) were conducted as a shared visit with non-physician practitioner(s) and myself.  I personally evaluated the patient during the encounter  Doug Sou, MD 04/01/13 2355

## 2013-04-01 NOTE — Progress Notes (Signed)
[facility] PROGRESS NOTE  Krisi Azua ZOX:[id number] DOB: [birth year] DOA: 03/30/2013 PCP: No primary provider on file.  Assessment/Plan: 1. Alcohol Withdrawal - Continued on IV Ativan CIWA Protocol - shaking, HR, RR improved 2. Alcoholism- - Behavioral health consulted - recs noted - Plan for outpt program - Would discuss with Psych SW when available 3. Sinus Tachycardia - due to Withdrawal, CIWA Protocol ordered and IVFs for rehydration. - HR normalized 4. HTN- Monitor, Clonidine PRN. Continue Toprol which she is on, and Ramipril, and Aldactizide.  - Stable 5. Hyperlipidemia- continue Fish oil Omega 3 6. GERD- Protonix daily.  7. Nausea and Vomiting  - Due to #1, resolved as of this AM 8. Lung Cancer - Medical Records form [facility] requested, pending. No acute pulmonary issues. Would defer to outpt mgt and w/u. 9. COPD -Stable - PRN Albuterol inhaler.  10. Lovenox for DVT prophylaxis.   Code Status: Full Family Communication: Pt in room (indicate person spoken with, relationship, and if by phone, the number) Disposition Plan: Pending enrollment at CD IOP program  Consultants:  Psychiatry  HPI/Subjective: Feels much improved.  Objective: Filed Vitals:   04/01/13 0345 04/01/13 0500 04/01/13 0520 04/01/13 0825  BP: 176/89 166/91 159/89 146/77  Pulse: 83 74 77 75  Temp: 97.8 F (36.6 C)   98.2 F (36.8 C)  TempSrc: Oral   Oral  Resp: 20 16 18    Height:      Weight:      SpO2: 98% 97% 97%     Intake/Output Summary (Last 24 hours) at 04/01/13 0923 Last data filed at 04/01/13 0700  Gross per 24 hour  Intake   1320 ml  Output      0 ml  Net   1320 ml   Filed Weights   03/31/13 0045  Weight: 68.9 kg (151 lb 14.4 oz)    Exam:   General:  Awake, in nad  Cardiovascular: regular, s1, s2  Respiratory: normal resp effort, no wheezing  Abdomen: soft, nondistended  Musculoskeletal: perfused, no clubbing   Data Reviewed: Basic Metabolic  Panel:  Recent Labs Lab 03/30/13 1430 03/31/13 0430  NA 129* 130*  K 3.6 3.5  CL 83* 92*  CO2 26 25  GLUCOSE 91 86  BUN 9 9  CREATININE 0.59 0.61  CALCIUM 9.6 8.1*   Liver Function Tests:  Recent Labs Lab 03/30/13 1430  AST 260*  ALT 168*  ALKPHOS 84  BILITOT 0.7  PROT 8.3  ALBUMIN 5.1   No results found for this basename: LIPASE, AMYLASE,  in the last 168 hours No results found for this basename: AMMONIA,  in the last 168 hours CBC:  Recent Labs Lab 03/30/13 1430 03/31/13 0430  WBC 5.3 5.5  HGB 14.1 11.0*  HCT 39.1 30.7*  MCV 96.8 96.8  PLT 236 174   Cardiac Enzymes: No results found for this basename: CKTOTAL, CKMB, CKMBINDEX, TROPONINI,  in the last 168 hours BNP (last 3 results) No results found for this basename: PROBNP,  in the last 8760 hours CBG: No results found for this basename: GLUCAP,  in the last 168 hours  Recent Results (from the past 240 hour(s))  MRSA PCR SCREENING     Status: None   Collection Time    03/31/13  1:08 AM      Result Value Range Status   MRSA by PCR NEGATIVE  NEGATIVE Final   Comment:            The GeneXpert MRSA Assay (  FDA     approved for NASAL specimens     only), is one component of a     comprehensive MRSA colonization     surveillance program. It is not     intended to diagnose MRSA     infection nor to guide or     monitor treatment for     MRSA infections.     Studies: No results found.  Scheduled Meds: . enoxaparin (LOVENOX) injection  40 mg Subcutaneous Daily  . folic acid  1 mg Oral Daily  . hydrochlorothiazide  25 mg Oral Daily  . LORazepam  0-4 mg Oral Q6H   Followed by  . LORazepam  0-4 mg Oral Q12H  . metoprolol succinate  25 mg Oral Daily  . multivitamin with minerals  1 tablet Oral Daily  . omega-3 acid ethyl esters  1 g Oral Daily  . pantoprazole  40 mg Oral Daily  . ramipril  5 mg Oral Daily  . spironolactone  25 mg Oral Daily  . thiamine  100 mg Oral Daily   Or  . thiamine  100 mg  Intravenous Daily   Continuous Infusions: . sodium chloride 100 mL/hr at 04/01/13 9604    Principal Problem:   Alcohol withdrawal Active Problems:   HYPERTENSION   Alcoholism   Sinus tachycardia   Nausea and vomiting   History of lung cancer    Time spent:    CHIU, STEPHEN K  [facility] Pager [phone]. If 7PM-7AM, please contact night-coverage at [email or web address], password [facility] 04/01/2013, 9:23 AM  LOS: 2 days

## 2013-04-05 ENCOUNTER — Encounter (HOSPITAL_COMMUNITY): Payer: Self-pay | Admitting: Psychology

## 2013-04-06 ENCOUNTER — Encounter (HOSPITAL_COMMUNITY): Payer: Self-pay

## 2013-04-06 ENCOUNTER — Other Ambulatory Visit (HOSPITAL_COMMUNITY): Payer: BC Managed Care – PPO | Attending: Psychiatry | Admitting: Psychology

## 2013-04-06 DIAGNOSIS — F1010 Alcohol abuse, uncomplicated: Secondary | ICD-10-CM | POA: Insufficient documentation

## 2013-04-06 DIAGNOSIS — F1020 Alcohol dependence, uncomplicated: Secondary | ICD-10-CM

## 2013-04-06 MED ORDER — TRAZODONE HCL 50 MG PO TABS: 50.0000 mg | ORAL_TABLET | Freq: Every day | ORAL | Status: DC

## 2013-04-06 NOTE — Progress Notes (Unsigned)
Patient ID: Claire Hamilton, female   DOB: Apr 19, 1956, 57 y.o.   MRN: [id number] Orientation to CD-IOP: The patient is a 57 yo married, Caucasian, female seeking treatment to address her alcohol dependence. She lives in [place] with her husband. The patient was referred to the program while inpatient at [facility] for alcohol detox. The patient reported a long history of alcohol use that began at age 57. She has been drinking vodka for years and reported she consumes "11-12 ounces" daily. The patient admitted taking a few tokes of cannabis every once in a while at parties, but has never purchased it herself. She guessed that she last smoked cannabis ten years ago. She also admitted trying cocaine, "like everyone else", she explained. The patient reported she had done a few lines in her early 20's, but had not used any cocaine for 35+ years. She denied any other illicit drug use. When asked whether she had ever had a DWI, she laughed and said, "of course". When asked what had precipitated her to seek treatment, the patient reported she had become very sick over the past few weeks, had stopped eating, and was just drinking. She felt horrible and recognized she had to do something. This is what had brought her to treatment. The patient reported growing up in a volatile family. Her father was an alcoholic and her mother very distant. While her father is deceased, her mother lives in [place] at [employer]. The patient was the middle child of 5 children. One brother was killed in an accident while riding a tractor and she admitted this loss proved devastating to the family. She reported being very close to her oldest sibling who lives in the [place] area, but has no relationship with her other siblings or her mother. The patient reported she has struggled for years with sleep problems. She began taking Ambien a few years ago, but after sleep walking and blackouts, her husband insisted she stop taking  the Ambien. In November of 2013, she stopped the Ambien, but reported her drinking increased and she sought sleep through the alcohol. She finally did get a new sleep medication, Lunesta, which was prescribed by her PCP, Windle Guard, MD of the [facility]. During the orientation, the patient agreed to stop taking it because it is against the policies of the CD-IOP, but understood a new, non-brain rewarding sleep medication would be prescribed by the medical director. In addition to her alcoholism and sleep disorder, the patient is a 4-year lung cancer survivor, has suffered an aneurysm, and has a steel plate in her neck after being thrown from a horse. She reported she does have strong support from her husband, along with her best friend who is also in recovery, as well as close neighbors who are aware of her problem with alcohol. She was very agreeable to attending AA meetings and seemed very determined and committed to making changes and embracing a new abstinence-based lifestyle. The documentation was reviewed, signed and the orientation completed accordingly. The patient will return tomorrow to begin the CD-IOP.

## 2013-04-08 ENCOUNTER — Encounter (HOSPITAL_COMMUNITY): Payer: Self-pay | Admitting: Psychology

## 2013-04-08 NOTE — Progress Notes (Signed)
    Daily Group Progress Note  Program: CD-IOP   Group Time: 1-2:30 pm  Participation Level: Active  Behavioral Response: Appropriate and Sharing  Type of Therapy: Process Group  Topic: Group Process' the first half of group was spent in process. Members shared about current issues and  concerns in early recovery. One member complained and made what seemed like excuses, but another member challenged him to make the needed changes that are required to stop using. There was good feedback and disclosure among members and the session proved effective.  Group Time: 2:45-4pm  Participation Level: Minimal  Behavioral Response: Sharing  Type of Therapy: Psycho-education Group  Topic: Criteria for Substance Dependence/Graduation: the second half of group was spent in a psycho-ed session with a handout and discussion on the criteria for substance dependence as found in the DSM-V. Every group member except one, admitted meeting at least 6 of the 7 criteria, while the newest member admitted only four. This proved very enlightening to a few members while most of the group had never denied their addiction. There were other issues that came to light in this session and good disclls8ure and feedback among the group. At the conclusion of the session a graduation ceremony was held. Kind and hopeful words were shared and the graduating member noted she was very pleased by the support of her fellow group members.  Summary: The patient was new to the group and appeared very relaxed in this first session. She reported she had made all of her closest friends aware of her entry into treatment and the group applauded her for this. She reported she has had many health problems and had stopped smoking after her lung cancer diagnosis. The patient spent most of the second half of group in a session with the medical director, but she was very attentive and engaged and committed to attending the women's meeting tomorrow  at 11 am and meeting another group member there. Her sobriety date is 9/5.  Family Program: Family present? No   Name of family member(s):   UDS collected: No Results:  AA/NA attended?: No, but she is new to the program and has stated she will attend meetings  Sponsor?: No   Jahmil Macleod, LCAS

## 2013-04-09 ENCOUNTER — Other Ambulatory Visit (HOSPITAL_COMMUNITY): Payer: BC Managed Care – PPO | Admitting: Psychology

## 2013-04-09 DIAGNOSIS — F1020 Alcohol dependence, uncomplicated: Secondary | ICD-10-CM

## 2013-04-10 ENCOUNTER — Encounter (HOSPITAL_COMMUNITY): Payer: Self-pay | Admitting: Psychology

## 2013-04-10 NOTE — Progress Notes (Signed)
    Daily Group Progress Note  Program: CD-IOP   Group Time: 1-2:30 pm  Participation Level: Active  Behavioral Response: Appropriate and Sharing  Type of Therapy: Process Group  Topic: Group Process: the first part of group was spent in process. Members shared about their past weekend and any concerns or issues that may have arisen. One member was very upset and teary and shared that a very close friend had relapsed and he was concerned that this person could die. This invited a brief discussion on the ability to control only one's self and how futile it is to try, despite many efforts, to try and control others. It was also a good experience for him and other group members to experience how it feels to be on the 'other side'. That would be the side that is witnessing a loved one who is using drugs and the fears about safety and even death. Three group members were absent and the group as noticeably smaller than in previous sessions.   Group Time: 2:45- 4pm  Participation Level: Active  Behavioral Response: Sharing  Type of Therapy: Psycho-education Group  Topic: HeartMath and Stress: the second part of group as spent in a psychoed session on the problems that develop through Stress and a training exercise on "HeartMath" to combat Stress. A brief discussion ensued on Stress with an accompanying handout. Despite being familiar with the dangers of stress, no one could identify, beyond increased blood pressure, why stress was so damaging and destructive. An explanation of HeartMath was provided and members proceeded to hook up individually to the system while other group members observed their progress. The session proved very effective with almost all members, except one, experiencing a considerable reduction in stress and the ability to breath slowly and control their heart rate. Members seemed genuinely interested in this process.   Summary: The patient denied having attended any AA  meetings over the weekend, despite having agreed to meet one of the group members at the women's Saturday morning meeting at the [employer]. She offered a defensive and resistant explanation before I even questioned this. The patient explained her husband had asked her to spend the weekend with him and noted he had told her she could do whatever she wanted on weekdays. This seemed like an incredibly unreasonable request considering this patient is very advanced in her addiction with extremely serious implications should she continue to use. She also complained about the sleep medication prescribed by the medical director and insisted that it had done just what she tol him it would do when they met on Friday. In the HeartMath session, the patient hooked up, but was unable to get out of the red for any period of time. She seemed very taken with this exercise and agreed she would practice her breathing and work on this at home. She wondered if not having used her inhaler today may have contributed to her problems breathing more easily? The patient reported she would be attending the 10 am meeting tomorrow at the [facility]. The patient appears open to recovery despite not showing up for that first meeting on Saturday. Her sobriety date is 9/5.    Family Program: Family present? No   Name of family member(s):   UDS collected: Yes Results:not back yet  AA/NA attended?: No , but patient reported she would attend a meeting tomorrow at 10 am.  Sponsor?: No   Devonia Farro, LCAS

## 2013-04-11 ENCOUNTER — Other Ambulatory Visit (HOSPITAL_COMMUNITY): Payer: BC Managed Care – PPO | Admitting: Psychology

## 2013-04-11 DIAGNOSIS — F1020 Alcohol dependence, uncomplicated: Secondary | ICD-10-CM

## 2013-04-13 ENCOUNTER — Other Ambulatory Visit (HOSPITAL_COMMUNITY): Payer: BC Managed Care – PPO | Admitting: Psychology

## 2013-04-13 DIAGNOSIS — F1020 Alcohol dependence, uncomplicated: Secondary | ICD-10-CM

## 2013-04-13 MED ORDER — HYDROXYZINE HCL 50 MG PO TABS: 50.0000 mg | ORAL_TABLET | Freq: Every day | ORAL | Status: DC

## 2013-04-13 NOTE — Progress Notes (Signed)
Patient ID: Claire Hamilton, female   DOB: 04-10-1956, 57 y.o.   MRN: [id number]   Subjective: Claire Hamilton continues to complain about poor sleep. She reports that she normally is lies awake until about 4 AM, then force herself to get up at 7 AM. She states that she does okay during the day until about 5 PM when she starts to experience a lack of energy. She reports that she has experiences sleep pattern since she went through menopause, which she states occurred in her 64s. One week ago we started her on trazodone 50 mg at bedtime, and she has tried as much is 100 mg, and reports that not only did she still stay up till 4 AM, it cost her to have racing thoughts. She reports she has tried Benadryl in the past and states that it had no effect.  Objective: Well-nourished well-developed, well groomed and casually dressed, alert and oriented, good eye contact, cognitive functioning within normal limits, insight and judgment are fair, speech is clear and coherent had a regular rate and rhythm with increased volume.  Assessment and Plan: We will discontinue trazodone, and try Vistaril 50-100 mg at bedtime. Followup next week.

## 2013-04-14 ENCOUNTER — Encounter (HOSPITAL_COMMUNITY): Payer: Self-pay | Admitting: Psychology

## 2013-04-14 NOTE — Progress Notes (Signed)
    Daily Group Progress Note  Program: CD-IOP   Group Time: 1-2:30 pm  Participation Level: Minimal  Behavioral Response: Sharing  Type of Therapy: Activity Group  Topic: Guest Speaker: the first half of group was spent with a visitor. He was a former group member who has attained over 16 months of sobriety. The patient shared about his struggles and those practices that have allowed him to remain sober. The emphasis was on the importance of the Fellowship of AA/NA, attending meetings consistently, and securing a sponsor. He invited feedback and the visited generated some good discussion.   Group Time: 2:45-4pm  Participation Level: Active  Behavioral Response: Sharing and Evasive  Type of Therapy: Process Group  Topic: Group Process: the second half of group was spent in process. Members shared about current issues and challenges in recovery. A new group member was present and she was invited to talk about what had brought her here and what she was seeking from the group. Drug tests collected on Monday were returned and three group members were asked to explain their positive results. None of them had shared anything of concern on Monday and had maintained their previous sobriety dates. The relapses were discussed and ways that they might have avoided the drug and alcohol use identified.  Summary:This patient was new. He introduced himself very briefly and explained to the visitor that he had attended 12-step meetings in the past, but just hadn't gone to any since he was discharged from detox. The patient noted he had attended 6 months of sobriety thanks largely to the Fellowship and he knew it would be a very important part of recovery for him. The patient did not return to group after the break and one member noted he had seen him get into his truck and drive away. Members expressed concern about his well-being and one fellow noted he seemed very nervous. I agreed to phone this new  group member and inquire about his intentions. The patient had offered his sobriety date as 9/5.    Family Program: Family present? No   Name of family member(s):   UDS collected: No Results:   AA/NA attended?: [place] and Tuesday  Sponsor?: No, but she is new   Brailynn Breth, LCAS

## 2013-04-16 ENCOUNTER — Other Ambulatory Visit (HOSPITAL_COMMUNITY): Payer: BC Managed Care – PPO | Admitting: Psychology

## 2013-04-16 DIAGNOSIS — F1020 Alcohol dependence, uncomplicated: Secondary | ICD-10-CM

## 2013-04-16 DIAGNOSIS — I10 Essential (primary) hypertension: Secondary | ICD-10-CM

## 2013-04-17 ENCOUNTER — Encounter (HOSPITAL_COMMUNITY): Payer: Self-pay | Admitting: Psychology

## 2013-04-17 NOTE — Progress Notes (Signed)
    Daily Group Progress Note  Program: CD-IOP   Group Time: 1-2:30 pm  Participation Level: Active  Behavioral Response: Appropriate and Sharing  Type of Therapy: Process Group  Topic: Group Process: the first part of group was spent in process. Members checked-in and a new group member introduced herself. During this session, the medical director was meeting with current members to discuss meds and the new group members. One member disclosed being shocked about a drug dream he had had the night before. Others also shared about drug dreams. One member shared about resentment with his sponsor and this generated a good discussion.   Group Time: 2:45- 4pm  Participation Level: Active  Behavioral Response: Sharing  Type of Therapy: Psycho-education Group  Topic: Psycho-Ed: the second half of group was spent in a [employer] Slide Show on early recovery. Different aspects of recovery and the specific vulnerabilities at different times were identified. Members shared about how they had relapsed and were able to identify these vulnerabilities or lapses in their program. The new group member shared at length about her history of drug use and the specifics of her relapse after 3 years of sobriety. The session proved effective for everyone present.   Summary: The patient reported she had attended the meeting this morning at 10 am and had secured a temporary sponsor. The group applauded this news. She was very pleased with this news and I applauded her involvement in AA and efforts to get involved and engaged in a recovery community so quickly after entering the program. Another member had shared her experiences previously and how she had relapsed and I had asked her to define what a [address]" is. It seems very obvious that this patient is on a pink cloud, but that is fine, but she needs to realize it and just enjoy this part of early recovery. The patient made some good comments in the second half of  group and another member encouraged her to practice making phone calls to the women in meetings who have given her their phone numbers. The patient is making excellent progress and has really immersed herself in recovery. She did meet with the medical director today to discuss her sleep medication. She had found the Trazadone had wired her and she insisted she couldn't take it. Today he prescribed her Vistaril and she agreed to fill the script on the way home. Her sobriety date is 9/5.     Family Program: Family present? No   Name of family member(s):   UDS collected: No Results:   AA/NA attended?: YesThursday and Friday  Sponsor?: Yes, she secured a temporary sponsor earlier this morning.   Avyonna Wagoner, LCAS

## 2013-04-18 ENCOUNTER — Encounter (HOSPITAL_COMMUNITY): Payer: Self-pay | Admitting: Psychology

## 2013-04-18 ENCOUNTER — Other Ambulatory Visit (HOSPITAL_COMMUNITY): Payer: BC Managed Care – PPO | Admitting: Psychology

## 2013-04-18 DIAGNOSIS — F1020 Alcohol dependence, uncomplicated: Secondary | ICD-10-CM

## 2013-04-18 NOTE — Progress Notes (Unsigned)
Patient ID: Claire Hamilton, female   DOB: 02-14-1956, 57 y.o.   MRN: [id number] CD-IO: Treatment Planning Session. I met with the patient for the first individual therapy session. I explained the importance of identifying goals for treatment. She had just come from the 10 am AA meeting at the [facility] and reported it had been another really good one. She continues to meet new women and receive validation and support for her early recovery. The patient was very agreeable to the first two goals, which include continued sobriety and building support for recovery. When asked about anything else she wished to work on, the patient admitted, "I see my marriage going downhill". She explained that her husband has been very distant and withdrawn and recently refused to provide his wife with his internet password. He had tried to lock the office door when he was on the computer, but the patient stated she had put a quick stop to that. The patient pointed out that "Cindee Lame is acting like there is nothing wrong", but she noted that he is drinking as many as 6 beers when he gets off work and that is not like him.  She reminded me that he had had an affair before and they had separated in 2002 and actually divorced in 2004. The patient reported he was exhibiting some of those same behaviors now and she suspects he is messing around. The patient reported she had had problems with sex when she was going through menopause, but explained she would do anything to remain married and wanted to re-initiate their intimate relationship. She admitted they had not had sex is almost 10 months and it was not like him to go without sex. The patient reported she had found a business card for a massage therapist in his wallet, but when she looked a day or two later, it was gone. We discussed how she might approach him today - they had planned on doing some work in their sun porch this afternoon and she would ask him about their marriage at  that time. We agreed that she would keep things civil and use the phone to call some of her old friends or new friends if she gets upset. When I expressed concerns about her sobriety, the patient assured me she had no intention to drink, no matter what happened. The treatment plan was completed and our session ended. She will return tomorrow for the group session at 1 pm
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
## 2013-04-19 ENCOUNTER — Encounter (HOSPITAL_COMMUNITY): Payer: Self-pay | Admitting: Psychology

## 2013-04-19 NOTE — Progress Notes (Signed)
    Daily Group Progress Note  Program: [facility]-IOP   Group Time: 1-2:30 pm  Participation Level: Active  Behavioral Response: Appropriate and Sharing  Type of Therapy: Education and Training Group  Topic: Education: The first half of group was spent with one of the pharmacists housed upstairs in the residential facility. Peggye Fothergill visited the group to discuss various categories of drugs, their effects and withdrawal symptoms, medications prescribed to address cravings and other complications along with prescribed medications for various diagnoses and mental illnesses. There were many questions and comments among the group and they stated the session proved very informative.  Group Time: 2:45- 4pm  Participation Level: Active  Behavioral Response: Sharing  Type of Therapy: Process Group  Topic: Group process: members discussed current issues and concerns in early recovery. One member expressed frustration with the difficulties and challenges in early recovery. Another expressed frustration with his sponsor. Group members provided good feedback and support to their fellow group members. Some drug tests were collected while others were returned from the previous collection.   Summary: The patient asked a number of questions and made good comments with the guest speaker. She stated the visit proved very informative. In process, the patient shared that she suspected that her husband was cheating on her. She had questioned his happiness about their marriage yesterday and he had stated he wanted to be married. She had found their exchange validating, but still wasn't certain. The patient had attended the AA meeting today at 10 and has continued to find this meeting very support and incredibly kind and validating. She responded well to this intervention and her sobriety date remains 9/5.   Family Program: Family present? No   Name of family member(s):   UDS collected: No Results:   AA/NA  attended?: YesMonday, Tuesday and Wednesday  Sponsor?: Yes, she has as temporary sponsor   Janessa Mickle, LCAS

## 2013-04-20 ENCOUNTER — Other Ambulatory Visit (HOSPITAL_COMMUNITY): Payer: BC Managed Care – PPO | Admitting: Psychology

## 2013-04-20 DIAGNOSIS — F1020 Alcohol dependence, uncomplicated: Secondary | ICD-10-CM

## 2013-04-20 DIAGNOSIS — I10 Essential (primary) hypertension: Secondary | ICD-10-CM

## 2013-04-20 NOTE — Progress Notes (Signed)
Patient ID: Claire Hamilton, female   DOB: 1955-10-06, 57 y.o.   MRN: [id number]   Subjective: Meriam Sprague asked to meet with this provider because she is experiencing increased anxiety. She reports that she and her husband are having marital problems. She states that he is angry at her and is bringing up things from the past, and she feels that he may be having an affair. At this time she does not want any medication for anxiety.  Objective: Well-nourished and well-developed, alert and oriented, good eye contact, hyperverbal with increased volume , significant rumination and perseveration, anxious and irritable with congruent affect, insight and judgment are impaired.  Assessment and Plan: Patient is experiencing life without alcohol for the first time and an extended period. She is having some irritability and anxiety, but chooses not to medicate at this time. We'll continue to follow the patient progress. Patient has been instructed to focus on her own recovery and progress, and not be overly concerned with her husband's behavior at this time.

## 2013-04-22 ENCOUNTER — Encounter (HOSPITAL_COMMUNITY): Payer: Self-pay | Admitting: Psychology

## 2013-04-22 NOTE — Progress Notes (Signed)
    Daily Group Progress Note  Program: [facility]-I[facility]   Group Time: 1-2:30 pm  Participation Level: Active  Behavioral Response: Sharing, Rigid  Type of Therapy: Process Group  Topic: Group process; the first part of group was spent in process. Members shared bout the past weekend and any issues or challenges in early recovery. There were various events and struggles, but only one member relapsed. That relapse and the timeline of his day were drawn on the board and discussed at length. The patient was able to clearly articulate things he could have done instead of using.   Group Time: 2:45- 4pm  Participation Level: Active  Behavioral Response: Sharing  Type of Therapy: Psycho-education Group  Topic: Your Recovery IQ. The second half of group was spent in a psycho-ed piece with a handout from the [facility]. Members were asked to identify their "IQ" relative to 11 different ways to avoid relapse. The handout and ensuing discussion proved informative and all members shared what one thing they might do improve their IQ.  Summary: The patient reported she had had a good weekend. She had worked hard to clean her sun porch and explained she had not done much cleaning over the past few months since her drinking had increased. She reported it had taken her the entire weekend to get things in order, but there was still much to do. When asked about calling other women in recovery over the weekend, the patient admitted she had not phoned anyone  that she has met at the meetings, but noted she had spoken with a number of friends. Another member explained that it was important to be able to contact others in recovery because they will understand if she is struggling with cravings. The patient reminded the group that she hasn't had any cravings and has no intention of drinking any more. She admitted she doesn't even want to drink. Almost everyone replied in unison, "you will". At the break, the  patient reported she had had a terrible experience with the Vistaril and was not going to take it again. I explained that sometimes it takes about a week for one's body to accommodate to new medications, but I thought those would go away pretty quickly. She stated she could not possibly take a medication that made her feel "so out of it" in the morning when she wakes up. She stated she would rather struggle with sleep than take that medication. I reminded her that she could meet with Hessie Diener if she wasn't finding any relief for her sleeplessness. In the second half of group, the patient was asked what she is doing to increase her Recovery IQ? The patient reported she is beginning to go the gym and exercise and that she is keeping busy. I congratulated her on the gym and encouraged her to attend at a pace that she can maintain. Her sobriety date remains 9/5.  Family Program: Family present? No   Name of family member(s):   UDS collected: No Results:   AA/NA attended?: No, not over the weekend  Sponsor?: No   Elinore Shults, LCAS

## 2013-04-23 ENCOUNTER — Encounter (HOSPITAL_COMMUNITY): Payer: Self-pay | Admitting: Psychology

## 2013-04-23 ENCOUNTER — Other Ambulatory Visit (HOSPITAL_COMMUNITY): Payer: BC Managed Care – PPO | Admitting: Psychology

## 2013-04-23 DIAGNOSIS — I10 Essential (primary) hypertension: Secondary | ICD-10-CM

## 2013-04-23 DIAGNOSIS — F1020 Alcohol dependence, uncomplicated: Secondary | ICD-10-CM

## 2013-04-23 NOTE — Progress Notes (Addendum)
Daily Group Progress Note  Program: [facility]-IOP   Group Time: 1-2:30 pm  Participation Level: Active  Behavioral Response: Passive-Aggressive, Blaming and Agitated  Type of Therapy: Process Group  Topic: Group Process: The first half of group was spent in process. Members shared about the past few days since the group last met as well as plans for the weekend. One member admitted he had relapsed Wednesday evening. He described the chain of events that led to the relapse and members provided feedback about the relapse. Also present were two new group members who were asked to introduce themselves briefly during this half of group. During this session, the medical director pulled out two current members who had asked about speaking with him as well as a new group member who had first appeared this past Monday.   Group Time: 2:45-4 pm  Participation Level: Active  Behavioral Response: Sharing and Grandiose  Type of Therapy: Psycho-education Group  Topic:The Serenity Prayer/Graduation; The second half of group was spent in a psycho-ed session. It included a handout with the Serenity Prayer and a second page asking to identify what 5 things you can and cannot change. After being given a few minutes to consider the questions and indemnify answers, members shared their answers. At the end of this exercise, a graduation ceremony was held honoring a member who had already completed the program, but had not yet gone through the actual ceremony. She had appeared at the break and participated in the second half of group. There were kind words shared by members, new and old, and the graduating member shared heartfelt words about the importance of group and how much she missed the support and ability to share her feelings. Her words were very powerful and she urged the group to be as open and honest as they possibly could. This group session proved effective for all present.    Summary: The patient  checked-in with a sobriety date of 9/5. She was noticeably quiet during the early part of group when members were asked to comment on the relapse disclosed by one of the group members. When I asked if she would comment, the patient responded with an angry and aggressive tirade directed at me. She reported she was angry that I had made her talk about the concerns she had about her marriage and it was no one's business but her own. She insisted that there would be no more discussion about her marriage. She was noticeably angry. No one said anything and everyone, including myself, were stunned by her anger and insistence. I explained that this was her prerogative, but she had not been made to disclose anything about her marriage during Wednesday's group and she had been the one to discuss it. She repeated her insistence that it was not to be discussed in group. The session continued, but things felt a little tense and she appeared very defensive for much of the remainder of this session. At one point, the patient expressed frustration about the relapse and it seemed incredulous to her that he just couldn't throw the bag of heroin away. Another member explained that it wasn't quite that easy. In the second half of group, the patient identified that she could not change what other people think. She did acknowledge that she can change her attitudes and noted that she has a lot of resentments. The patient asked to speak with the medical director and she did meet with him  to share her concerns. The  patient's sobriety date remains 9/5.    Family Program: Family present? No   Name of family member(s):   UDS collected: No Results: AA/NA attended?: YesThursday and Friday  Sponsor?: Yes, she has a temporary sponsor   Khloie Hamada, LCAS

## 2013-04-25 ENCOUNTER — Other Ambulatory Visit (HOSPITAL_COMMUNITY): Payer: Self-pay | Admitting: Physician Assistant

## 2013-04-25 ENCOUNTER — Other Ambulatory Visit (HOSPITAL_COMMUNITY): Payer: BC Managed Care – PPO | Attending: Psychiatry | Admitting: Psychology

## 2013-04-25 DIAGNOSIS — I10 Essential (primary) hypertension: Secondary | ICD-10-CM

## 2013-04-25 DIAGNOSIS — F1010 Alcohol abuse, uncomplicated: Secondary | ICD-10-CM | POA: Insufficient documentation

## 2013-04-25 DIAGNOSIS — F1020 Alcohol dependence, uncomplicated: Secondary | ICD-10-CM

## 2013-04-25 MED ORDER — TRAZODONE HCL 100 MG PO TABS: 100.0000 mg | ORAL_TABLET | Freq: Every day | ORAL | Status: DC

## 2013-04-25 NOTE — Telephone Encounter (Signed)
Claire Hamilton requested to resume trazodone for sleep. She reports that she has been taking 2 of the 50 mg tablets. Will prescribe 100 mg tablets to take 1 at bedtime. Prescription sent to patient's pharmacy.

## 2013-04-26 ENCOUNTER — Encounter (HOSPITAL_COMMUNITY): Payer: Self-pay | Admitting: Psychology

## 2013-04-26 NOTE — Progress Notes (Signed)
    Daily Group Progress Note  Program: [facility]OP   Group Time: 1-2:30 pm  Participation Level: Active  Behavioral Response: Appropriate and Sharing  Type of Therapy: Process Group  Topic: Group Process; the first part of group was spent in process. Members shared about the past weekend and any issues or concerns in early recovery. One member admitted taking Ativan on Saturday and Sunday because of a non-stop migraine. Another group member reported he had taken some sleep medication because of his restless leg syndrome was keeping him from sleeping.  Another member reported he had secured a temporary sponsor and was supposed to meet him at his home tonight and then attend an AA meeting together. There was a good discussion and feedback provided among group members.   Group Time: 2:45-4pm  Participation Level: Active  Behavioral Response: Sharing  Type of Therapy: Psycho-education Group  Topic: Communication Styles: Which One Are You? A psycho-ed session followed the break. A handout on communication was provided and members identified and discussed Four Communication Styles, including Aggressive, Passive, Passive-Aggressive, and Assertive. A discussion identifying the characteristics of each style and how they differ continued with members identifying the style they most displayed. The importance of being open, honest, and asking for what you need was emphasized. There was good feedback with most members agreeing they were passive-aggressive.    Summary: the patient was attentive and engaged and reported she had had a good weekend. She looked good and was dressed nicely and she received a compliment from another group member on how good she looked today. The patient was very pleased and noted that she has been receiving lots of compliments from people at the meetings, friends, and even neighbors. She admitted they made her feel better and made her want to do better. The patient shared good  feedback with group members and was open about her own patterns of communication. She had attended a meeting today and continues to work out at [employer]. She is making good progress and her sobriety date remains 9/5.    Family Program: Family present? No   Name of family member(s):   UDS collected: Yes Results: negative  AA/NA attended?: [place]  Sponsor?: Yes, she has acquired a temporary sponsor   Huckleberry Martinson, LCAS

## 2013-04-27 ENCOUNTER — Other Ambulatory Visit (HOSPITAL_COMMUNITY): Payer: BC Managed Care – PPO | Admitting: Psychology

## 2013-04-27 DIAGNOSIS — F1020 Alcohol dependence, uncomplicated: Secondary | ICD-10-CM

## 2013-04-27 DIAGNOSIS — I10 Essential (primary) hypertension: Secondary | ICD-10-CM
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
## 2013-04-30 ENCOUNTER — Encounter (HOSPITAL_COMMUNITY): Payer: Self-pay | Admitting: Psychology

## 2013-04-30 ENCOUNTER — Other Ambulatory Visit (HOSPITAL_COMMUNITY): Payer: BC Managed Care – PPO | Admitting: Psychology

## 2013-04-30 DIAGNOSIS — F1020 Alcohol dependence, uncomplicated: Secondary | ICD-10-CM

## 2013-04-30 DIAGNOSIS — I10 Essential (primary) hypertension: Secondary | ICD-10-CM

## 2013-04-30 NOTE — Progress Notes (Signed)
    Daily Group Progress Note  Program: CD-IOP   Group Time: 1-2:30 pm  Participation Level: Active  Behavioral Response: Appropriate and Sharing  Type of Therapy: Process Group  Topic: Group Process: the first p-art of group was spent in process. Members shared about current issues and concerns. One member who had been impaired on Wednesday appeared and he apologized to his fellow group members. A new group member was present and he shared a little about himself and what he needed from the group. During this session, the medical director, AW, saw the new patient and met with members about current medications, amounts, and any problems that they might be experiencing.   Group Time: 2:45- 4pm  Participation Level: Active  Behavioral Response: Sharing  Type of Therapy: Psycho-education Group  Topic: Resentments: The second half of group was spent in a presentation on resentments. The damage done by holding onto resentments was discussed. It included a handout and discussion about what benefits come with resentments and why people might hold onto them. Members shared about realizations about what they have been holding onto and admitted there were benefits to those resentments. There was good insight among the group and feedback proved very effective. The session went very well with good disclosure among the group members.    Summary: The patient reported she is doing really well and feels very good about herself. She had come from the 10 am AA meeting today and it had been very good. She insisted she had much resentment toward some of her family members, but insisted she had forgiven them. This patient doesn't seem capable of hearing others or considering anything, but her own opinion and perspective. She did not understand that resentments are let go when one forgives them. She seems very stuck and stubborn about considering views or possibilities other than her own. Her sobriety date is  9/5   Family Program: Family present? No   Name of family member(s):   UDS collected: No Results:   AA/NA attended?: YesThursday and Friday  Sponsor?: Yes   Areesha Dehaven, LCAS

## 2013-05-01 ENCOUNTER — Encounter (HOSPITAL_COMMUNITY): Payer: Self-pay | Admitting: Psychology

## 2013-05-01 NOTE — Progress Notes (Signed)
    Daily Group Progress Note  Program: CD-IOP   Group Time: 1-2:30 pm  Participation Level: Active  Behavioral Response: Sharing  Type of Therapy: Process Group  Topic: Group Process; the first part of group was spent in process. Members shared about the past weekend and events or activities they took part in to support their recovery. One member reported a new sobriety date. He disclosed having drunk on 2 different occasions. He has been kicked out of the [employer] where he had been living for the past 2 months.  The group assisted in processing the relapse and offered good feedback.   Group Time: 2:45- 4pm  Participation Level: Active  Behavioral Response: Sharing  Type of Therapy: Psycho-education Group  Topic: Forgiveness: the second half of group was spent in a session on Forgiveness. This psycho-ed followed a two day presentation on Resentments. The group shared their own experiences on forgiveness. A number of handouts were provided detailing how one might go about 'forgiving' another person, entity or institution. The session proved very effective for all present.   Summary: The patient reported she had had a tough weekend. Her husband had been very distant and cold. She shared that she felt she was being bullied by him, and the discrepancy between his behaviors and his verbal assurance of support were discouraging to her. Several group members asked questions about his behavior and offered feedback.  She also reported that she was worried about her dog, who was being treated at the vet.  Patient provided good feedback to another group member, but at times took the group off track with personal issues unrelated to the current topic.  She remains sober with a sobriety date of 9/5.   Family Program: Family present? No   Name of family member(s):   UDS collected: Yes Results: Lab results not back  AA/NA attended?: Yes Monday  Sponsor?: Yes   Sallyanne Birkhead,  LCAS

## 2013-05-02 ENCOUNTER — Other Ambulatory Visit (HOSPITAL_COMMUNITY): Payer: BC Managed Care – PPO | Admitting: Psychology

## 2013-05-02 DIAGNOSIS — F1020 Alcohol dependence, uncomplicated: Secondary | ICD-10-CM

## 2013-05-04 ENCOUNTER — Other Ambulatory Visit (HOSPITAL_COMMUNITY): Payer: BC Managed Care – PPO | Admitting: Psychology

## 2013-05-04 DIAGNOSIS — I10 Essential (primary) hypertension: Secondary | ICD-10-CM

## 2013-05-04 DIAGNOSIS — F1020 Alcohol dependence, uncomplicated: Secondary | ICD-10-CM

## 2013-05-06 ENCOUNTER — Encounter (HOSPITAL_COMMUNITY): Payer: Self-pay | Admitting: Psychology
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
## 2013-05-06 NOTE — Progress Notes (Signed)
    Daily Group Progress Note  Program: CD-IOP   Group Time: 1-2:30 pm  Participation Level: Minimal  Behavioral Response: Appropriate and Sharing  Type of Therapy: Process Group  Topic: Group Process: the first part of group was spent in process. Members shared about challenges or struggles they are dealing with in early recovery. One member did not present as the group as known him and they continued to express concerns about him. Another member took a bottle of pills out of his pocket and was immediately challenged by members. The session proved very difficult, with continued denial and frustration, but it provided a very different perspective for group members who were seeing things from the other side - as one looking as the addict is using.   Group Time: 2:45- 4pm  Participation Level: Minimal  Behavioral Response: Sharing  Type of Therapy: Psycho-education Group  Topic:Communication Styles: Part II. The second half of group was spent in an ongoing presentation on communication. But prior to the psycho-ed, the group shared their feelings about the group member who had presented as impaired. Despite denying any use, a BAC collected at the break had been positive for alcohol. His car keys had been handed over and a call to his wife made. The group expressed frustration, anger, and disappointment at the impaired member. They also noted how upsetting it was to be sober and watch someone else who is very high - and denying it. The presentation continued and explored the topic discussed initially during the last group session. The 'benefits' of passivity and passive-aggressive communication styles were discussed at length. Members made good comments and the session proved effective.    Summary: The patient reported she is doing well and had attended a meeting yesterday and today. She provided good feedback, but was somewhat surprised and quiet during the intervention towards the impaired  group member. Afterwards, she admitted he was sitting next to her and although he didn't smell of alcohol, he had been squirming and had not sat in his 'regular' place in the room. In the second half of group, the patient reported she is very open about addressing her husband and others and no one challenged her on this disclosure despite its obvious inaccuracy. She had reprimanded me a few sessions ago for having 'made me' share about her marriage struggles with the group. The patient reported her sobriety date remains 9/5.   Family Program: Family present? No   Name of family member(s):   UDS collected: No Results:  AA/NA attended?: YesTuesday and Wednesday  Sponsor?: Yes   Claire Hamilton, LCAS

## 2013-05-07 ENCOUNTER — Other Ambulatory Visit (HOSPITAL_COMMUNITY): Payer: BC Managed Care – PPO

## 2013-05-07 ENCOUNTER — Encounter (HOSPITAL_COMMUNITY): Payer: Self-pay | Admitting: Psychology

## 2013-05-07 NOTE — Progress Notes (Signed)
    Daily Group Progress Note  Program: CD-IOP   Group Time: 1-2:30 pm  Participation Level: Active  Behavioral Response: Appropriate and Sharing  Type of Therapy: Process Group  Topic: Group members checked in by sharing their sobriety dates and one thing they had done to support their recovery since the previous group session.  All group members reported attending AA or NA meetings over the last 2 days and discussed their experience at the meetings.  The group also discussed separating their behaviors during active addiction from themselves as people.  Group Time: 2:45- 4pm  Participation Level: Active  Behavioral Response: Sharing  Type of Therapy: Psycho-education Group  Topic: The group completed the Wheel of Life exercise.  They rated their satisfaction with 8 different areas of life (physical environment/home, fun and recreation, health, personal growth and spirituality, significant other and romance, friends and family, career, and money) by creating a new outer edge in each section of the wheel, such that a "fuller" section indicated more satisfaction.  Group members first completed the exercise on paper and then shared their responses by marking a wheel drawn on the board and discussing their choices.  The group discussed the importance of balance in these different life areas and discussed areas where they could make improvements.  Summary: Patient reported a sobriety date of 9/5 and shared that to support her recovery, she had attended meetings since the last group session.  She also shared that she was going to attend a new home group meeting later that evening, and was excited about that.  During the Wheel of Life exercise, patient reported high satisfaction with the personal growth, fun and recreation, and health areas.  She reported low satisfaction with the significant other category, and shared that she is having ongoing difficulties with her husband, who has not been  supportive of her recovery. She also shared relatively low satisfaction with the career category, and reported that she was unsure what that area represented for her. The patient has made excellent progress in her recovery, is sober over 30 days, is attending meetings and has a temporary sponsor.    Family Program: Family present? No   Name of family member(s):   UDS collected: No Results:   AA/NA attended?: YesThursday and Friday  Sponsor?: Yes   Adelma Bowdoin, LCAS

## 2013-05-09 ENCOUNTER — Other Ambulatory Visit (HOSPITAL_COMMUNITY): Payer: BC Managed Care – PPO

## 2013-05-10 ENCOUNTER — Ambulatory Visit (INDEPENDENT_AMBULATORY_CARE_PROVIDER_SITE_OTHER): Payer: BC Managed Care – PPO | Admitting: Surgery

## 2013-05-11 ENCOUNTER — Other Ambulatory Visit (HOSPITAL_COMMUNITY): Payer: BC Managed Care – PPO

## 2013-05-14 ENCOUNTER — Other Ambulatory Visit (HOSPITAL_COMMUNITY): Payer: BC Managed Care – PPO

## 2013-05-14 ENCOUNTER — Encounter (HOSPITAL_COMMUNITY): Payer: Self-pay | Admitting: Psychology

## 2013-05-16 ENCOUNTER — Encounter (INDEPENDENT_AMBULATORY_CARE_PROVIDER_SITE_OTHER): Payer: Self-pay | Admitting: Surgery

## 2013-05-16 ENCOUNTER — Other Ambulatory Visit (INDEPENDENT_AMBULATORY_CARE_PROVIDER_SITE_OTHER): Payer: Self-pay | Admitting: Surgery

## 2013-05-16 ENCOUNTER — Ambulatory Visit (INDEPENDENT_AMBULATORY_CARE_PROVIDER_SITE_OTHER): Payer: BC Managed Care – PPO | Admitting: Surgery

## 2013-05-16 ENCOUNTER — Other Ambulatory Visit (HOSPITAL_COMMUNITY): Payer: BC Managed Care – PPO

## 2013-05-16 VITALS — BP 120/90 | HR 72 | Temp 98.9°F | Resp 14 | Ht 64.0 in | Wt 137.8 lb

## 2013-05-16 DIAGNOSIS — L723 Sebaceous cyst: Secondary | ICD-10-CM

## 2013-05-16 NOTE — Patient Instructions (Signed)

## 2013-05-16 NOTE — Progress Notes (Signed)
Chief Complaint:  Mass in left groin crease  History of Present Illness:  Claire Hamilton is an 57 y.o. female with a growing nodule in her left groin.  No history of redness or infection although it does bother her more in the summer  Past Medical History  Diagnosis Date  . Hypertension   . Allergy   . GERD (gastroesophageal reflux disease)   . Dry eye   . Asthma   . Blood transfusion without reported diagnosis   . Lung cancer 2011    lung  . COPD (chronic obstructive pulmonary disease)   . Neuromuscular disorder     fibromyalgia    Past Surgical History  Procedure Laterality Date  . Appendectomy    . Brain surgery  2012    aneurism  . Hernia repair Left   . Small intestine surgery  1997  . Pneumonectomy  2011    cancerous tumor removal  . Cervical fusion  2000    C5-C7    Current Outpatient Prescriptions  Medication Sig Dispense Refill  . albuterol (PROVENTIL HFA;VENTOLIN HFA) 108 (90 BASE) MCG/ACT inhaler Inhale 2 puffs into the lungs every 6 (six) hours as needed for wheezing or shortness of breath.      Marland Kitchen aspirin EC 325 MG tablet Take 325 mg by mouth daily as needed for pain.      . Eszopiclone 3 MG TABS Take 3 mg by mouth at bedtime. Take immediately before bedtime      . fish oil-omega-3 fatty acids 1000 MG capsule Take 1 g by mouth daily.      . hydroxypropyl methylcellulose (ISOPTO TEARS) 2.5 % ophthalmic solution Place 1 drop into both eyes 4 (four) times daily as needed (dry eyes).      . metoprolol succinate (TOPROL-XL) 25 MG 24 hr tablet Take 25 mg by mouth daily.      . Multiple Vitamins-Minerals (ADULT GUMMY PO) Take 1 each by mouth daily.      . ramipril (ALTACE) 5 MG capsule Take 5 mg by mouth daily.      Marland Kitchen spironolactone-hydrochlorothiazide (ALDACTAZIDE) 25-25 MG per tablet Take 1 tablet by mouth daily.       No current facility-administered medications for this visit.   Amoxicillin; Codeine; Hydrocodone; Latex; and Naproxen Family History  Problem  Relation Age of Onset  . Diabetes Mother   . Heart disease Mother   . Clotting disorder Mother   . Hypertension Mother   . Depression Mother   . Hypertension Sister   . Depression Sister   . Alcohol abuse Father   . Drug abuse Cousin   . Alcohol abuse Paternal Uncle    Social History:   reports that she quit smoking about 3 years ago. Her smoking use included Cigarettes. She has a 80 pack-year smoking history. She has never used smokeless tobacco. She reports that she drinks about 42.0 ounces of alcohol per week. She reports that she does not use illicit drugs.   REVIEW OF SYSTEMS - PERTINENT POSITIVES ONLY: Recovering alcoholic  Physical Exam:   Blood pressure 120/90, pulse 72, temperature 98.9 F (37.2 C), temperature source Temporal, resp. rate 14, height 5\' 4"  (1.626 m), weight 137 lb 12.8 oz (62.506 kg). Body mass index is 23.64 kg/(m^2).  Gen:  WDWN wf NAD  Neurological: Alert and oriented to person, place, and time. Motor and sensory function is grossly intact  Head: Normocephalic and atraumatic.  Eyes: Conjunctivae are normal. Pupils are equal, round, and reactive to  light. No scleral icterus.  Neck: Normal range of motion. Neck supple. No tracheal deviation or thyromegaly present.  Cardiovascular:  SR without murmurs or gallops.  No carotid bruits Respiratory: Effort normal.  No respiratory distress. No chest wall tenderness. Breath sounds normal.  No wheezes, rales or rhonchi.  Abdomen:  neg GU: left groin crease there is a 2 cm raised mass with a punctum consistent with a sebaceous cyst Musculoskeletal: Normal range of motion. Extremities are nontender. No cyanosis, edema or clubbing noted Lymphadenopathy: No cervical, preauricular, postauricular or axillary adenopathy is present Skin: Skin is warm and dry. No rash noted. No diaphoresis. No erythema. No pallor. Pscyh: Normal mood and affect. Behavior is normal. Judgment and thought content normal.   LABORATORY  RESULTS: No results found for this or any previous visit (from the past 48 hour(s)).  RADIOLOGY RESULTS: No results found.  Problem List: Patient Active Problem List   Diagnosis Date Noted  . Alcohol withdrawal 03/31/2013  . Alcoholism 03/31/2013  . Sinus tachycardia 03/31/2013  . Nausea and vomiting 03/31/2013  . History of lung cancer 03/31/2013  . HYPERTENSION 02/05/2010  . CEREBRAL ANEURYSM 02/05/2010  . Chronic airway obstruction, not elsewhere classified 02/05/2010  . PULMONARY NODULE, RIGHT UPPER LOBE 02/05/2010  . OTHER AND UNSPECIFIED OVARIAN CYST 02/05/2010    Assessment & Plan: Sebaceous cyst of left groin    Matt B. Daphine Deutscher, MD, [facility] [phone] beeper [phone]  05/16/2013 12:04 PM

## 2013-05-18 ENCOUNTER — Other Ambulatory Visit (HOSPITAL_COMMUNITY): Payer: BC Managed Care – PPO

## 2013-05-21 ENCOUNTER — Other Ambulatory Visit (HOSPITAL_COMMUNITY): Payer: BC Managed Care – PPO

## 2013-05-23 ENCOUNTER — Other Ambulatory Visit (HOSPITAL_COMMUNITY): Payer: BC Managed Care – PPO

## 2013-05-25 ENCOUNTER — Other Ambulatory Visit (HOSPITAL_COMMUNITY): Payer: BC Managed Care – PPO

## 2013-05-27 NOTE — Progress Notes (Unsigned)
Patient ID: Claire Hamilton, female   DOB: [birth year], 57 y.o.   MRN: [id number] CD-IOP: Discharge. The patient is discharged from the CD-IOP today. Please see discharge summary and plan in Epic, 05/14/13.

## 2013-05-28 ENCOUNTER — Other Ambulatory Visit (HOSPITAL_COMMUNITY): Payer: BC Managed Care – PPO

## 2013-05-30 ENCOUNTER — Other Ambulatory Visit (HOSPITAL_COMMUNITY): Payer: BC Managed Care – PPO

## 2013-06-01 ENCOUNTER — Other Ambulatory Visit (HOSPITAL_COMMUNITY): Payer: BC Managed Care – PPO

## 2013-06-04 ENCOUNTER — Other Ambulatory Visit (HOSPITAL_COMMUNITY): Payer: BC Managed Care – PPO

## 2013-06-06 ENCOUNTER — Other Ambulatory Visit (HOSPITAL_COMMUNITY): Payer: BC Managed Care – PPO

## 2013-06-08 ENCOUNTER — Encounter (HOSPITAL_BASED_OUTPATIENT_CLINIC_OR_DEPARTMENT_OTHER)
Admission: RE | Disposition: A | Discharge status: Home / Self Care | Payer: Self-pay | Source: Ambulatory Visit | Attending: Surgery

## 2013-06-08 ENCOUNTER — Other Ambulatory Visit (HOSPITAL_COMMUNITY): Payer: BC Managed Care – PPO

## 2013-06-08 ENCOUNTER — Ambulatory Visit (HOSPITAL_BASED_OUTPATIENT_CLINIC_OR_DEPARTMENT_OTHER)
Admission: RE | Admit: 2013-06-08 | Discharge: 2013-06-08 | Disposition: A | Discharge status: Home / Self Care | Payer: BC Managed Care – PPO | Source: Ambulatory Visit | Attending: Surgery | Admitting: Surgery

## 2013-06-08 DIAGNOSIS — I10 Essential (primary) hypertension: Secondary | ICD-10-CM | POA: Insufficient documentation

## 2013-06-08 DIAGNOSIS — F1021 Alcohol dependence, in remission: Secondary | ICD-10-CM | POA: Insufficient documentation

## 2013-06-08 DIAGNOSIS — J45909 Unspecified asthma, uncomplicated: Secondary | ICD-10-CM | POA: Insufficient documentation

## 2013-06-08 DIAGNOSIS — Z8679 Personal history of other diseases of the circulatory system: Secondary | ICD-10-CM | POA: Insufficient documentation

## 2013-06-08 DIAGNOSIS — L723 Sebaceous cyst: Secondary | ICD-10-CM | POA: Insufficient documentation

## 2013-06-08 DIAGNOSIS — K219 Gastro-esophageal reflux disease without esophagitis: Secondary | ICD-10-CM | POA: Insufficient documentation

## 2013-06-08 DIAGNOSIS — IMO0001 Reserved for inherently not codable concepts without codable children: Secondary | ICD-10-CM | POA: Insufficient documentation

## 2013-06-08 DIAGNOSIS — H04129 Dry eye syndrome of unspecified lacrimal gland: Secondary | ICD-10-CM | POA: Insufficient documentation

## 2013-06-08 DIAGNOSIS — Z85118 Personal history of other malignant neoplasm of bronchus and lung: Secondary | ICD-10-CM | POA: Insufficient documentation

## 2013-06-08 HISTORY — PX: MASS EXCISION: SHX2000

## 2013-06-08 SURGERY — MINOR EXCISION OF MASS
Anesthesia: LOCAL | Site: Groin | Laterality: Left | Wound class: Clean

## 2013-06-08 MED ORDER — LIDOCAINE-EPINEPHRINE (PF) 1 %-1:200000 IJ SOLN
INTRAMUSCULAR | Status: AC
  Filled 2013-06-08: qty 10

## 2013-06-08 MED ORDER — SODIUM BICARBONATE 4 % IV SOLN
INTRAVENOUS | Status: AC
  Filled 2013-06-08: qty 5

## 2013-06-08 MED ORDER — SODIUM BICARBONATE 4 % IV SOLN
INTRAVENOUS | Status: DC | PRN
  Administered 2013-06-08: 08:00:00 via INTRAMUSCULAR

## 2013-06-08 MED ORDER — LIDOCAINE HCL (PF) 1 % IJ SOLN
INTRAMUSCULAR | Status: AC
  Filled 2013-06-08: qty 30

## 2013-06-08 SURGICAL SUPPLY — 25 items
ADH SKN CLS APL DERMABOND .7 (GAUZE/BANDAGES/DRESSINGS) ×1
APL SKNCLS STERI-STRIP NONHPOA (GAUZE/BANDAGES/DRESSINGS)
BENZOIN TINCTURE PRP APPL 2/3 (GAUZE/BANDAGES/DRESSINGS) IMPLANT
BLADE SURG 15 STRL LF DISP TIS (BLADE) ×1 IMPLANT
BLADE SURG 15 STRL SS (BLADE) ×2
DERMABOND ADVANCED (GAUZE/BANDAGES/DRESSINGS) ×1
DERMABOND ADVANCED .7 DNX12 (GAUZE/BANDAGES/DRESSINGS) ×1 IMPLANT
ELECT REM PT RETURN 9FT ADLT (ELECTROSURGICAL) ×2
ELECTRODE REM PT RTRN 9FT ADLT (ELECTROSURGICAL) IMPLANT
GAUZE SPONGE 4X4 12PLY STRL LF (GAUZE/BANDAGES/DRESSINGS) IMPLANT
GLOVE BIO SURGEON STRL SZ8 (GLOVE) IMPLANT
GLOVE SURG SS PI 7.0 STRL IVOR (GLOVE) ×1 IMPLANT
GLOVE SURG SS PI 8.0 STRL IVOR (GLOVE) ×2 IMPLANT
MARKER SKIN DUAL TIP RULER LAB (MISCELLANEOUS) ×2 IMPLANT
NDL HYPO 30X.5 LL (NEEDLE) ×1 IMPLANT
NEEDLE HYPO 30X.5 LL (NEEDLE) ×2 IMPLANT
PENCIL BUTTON HOLSTER BLD 10FT (ELECTRODE) ×1 IMPLANT
STRIP CLOSURE SKIN 1/2X4 (GAUZE/BANDAGES/DRESSINGS) IMPLANT
SUT PROLENE 5 0 P 3 (SUTURE) IMPLANT
SUT SILK 4 0 TIES 17X18 (SUTURE) ×2 IMPLANT
SUT VIC AB 4-0 SH 18 (SUTURE) ×1 IMPLANT
SUT VIC AB 5-0 P-3 18X BRD (SUTURE) IMPLANT
SUT VIC AB 5-0 P3 18 (SUTURE)
SWABSTICK POVIDONE IODINE SNGL (MISCELLANEOUS) ×4 IMPLANT
SYR CONTROL 10ML LL (SYRINGE) ×2 IMPLANT

## 2013-06-08 NOTE — Interval H&P Note (Signed)
History and Physical Interval Note:  06/08/2013 7:32 AM  Claire Hamilton  has presented today for surgery, with the diagnosis of sebaceous cyst   The various methods of treatment have been discussed with the patient and family. After consideration of risks, benefits and other options for treatment, the patient has consented to  Procedure(s): excision of sebaceous cyst  (Left) as a surgical intervention .  The patient's history has been reviewed, patient examined, no change in status, stable for surgery.  I have reviewed the patient's chart and labs.  Questions were answered to the patient's satisfaction.     Cindel Daugherty B

## 2013-06-08 NOTE — Op Note (Signed)
Surgeon: Wenda Low, MD, FACS  Asst:  none  Anes:  local  Procedure: Excision of left groin sebaceous cyst (2 cm)  Diagnosis: Sebaceous cyst  Complications: none  EBL:   minimal cc  Description of Procedure:  Taken to OR 6 at [facility].  Timeout and area previously marked in the crease of the left groin.  Infiltrated with 1% lidocaine with epi and neut.  Ellipse of skin with punctum and removal of sebacous cyst in toto.  Closed with 4-0 vicryl and Dermabond.  Specimen sent for path.  Patient to go home.    Matt B. Daphine Deutscher, MD, [facility] [place] [phone]

## 2013-06-08 NOTE — H&P (View-Only) (Signed)
Chief Complaint:  Mass in left groin crease  History of Present Illness:  Claire Hamilton is an 57 y.o. female with a growing nodule in her left groin.  No history of redness or infection although it does bother her more in the summer  Past Medical History  Diagnosis Date  . Hypertension   . Allergy   . GERD (gastroesophageal reflux disease)   . Dry eye   . Asthma   . Blood transfusion without reported diagnosis   . Lung cancer 2011    lung  . COPD (chronic obstructive pulmonary disease)   . Neuromuscular disorder     fibromyalgia    Past Surgical History  Procedure Laterality Date  . Appendectomy    . Brain surgery  2012    aneurism  . Hernia repair Left   . Small intestine surgery  1997  . Pneumonectomy  2011    cancerous tumor removal  . Cervical fusion  2000    C5-C7    Current Outpatient Prescriptions  Medication Sig Dispense Refill  . albuterol (PROVENTIL HFA;VENTOLIN HFA) 108 (90 BASE) MCG/ACT inhaler Inhale 2 puffs into the lungs every 6 (six) hours as needed for wheezing or shortness of breath.      . aspirin EC 325 MG tablet Take 325 mg by mouth daily as needed for pain.      . Eszopiclone 3 MG TABS Take 3 mg by mouth at bedtime. Take immediately before bedtime      . fish oil-omega-3 fatty acids 1000 MG capsule Take 1 g by mouth daily.      . hydroxypropyl methylcellulose (ISOPTO TEARS) 2.5 % ophthalmic solution Place 1 drop into both eyes 4 (four) times daily as needed (dry eyes).      . metoprolol succinate (TOPROL-XL) 25 MG 24 hr tablet Take 25 mg by mouth daily.      . Multiple Vitamins-Minerals (ADULT GUMMY PO) Take 1 each by mouth daily.      . ramipril (ALTACE) 5 MG capsule Take 5 mg by mouth daily.      . spironolactone-hydrochlorothiazide (ALDACTAZIDE) 25-25 MG per tablet Take 1 tablet by mouth daily.       No current facility-administered medications for this visit.   Amoxicillin; Codeine; Hydrocodone; Latex; and Naproxen Family History  Problem  Relation Age of Onset  . Diabetes Mother   . Heart disease Mother   . Clotting disorder Mother   . Hypertension Mother   . Depression Mother   . Hypertension Sister   . Depression Sister   . Alcohol abuse Father   . Drug abuse Cousin   . Alcohol abuse Paternal Uncle    Social History:   reports that she quit smoking about 3 years ago. Her smoking use included Cigarettes. She has a 80 pack-year smoking history. She has never used smokeless tobacco. She reports that she drinks about 42.0 ounces of alcohol per week. She reports that she does not use illicit drugs.   REVIEW OF SYSTEMS - PERTINENT POSITIVES ONLY: Recovering alcoholic  Physical Exam:   Blood pressure 120/90, pulse 72, temperature 98.9 F (37.2 C), temperature source Temporal, resp. rate 14, height 5' 4" (1.626 m), weight 137 lb 12.8 oz (62.506 kg). Body mass index is 23.64 kg/(m^2).  Gen:  WDWN wf NAD  Neurological: Alert and oriented to person, place, and time. Motor and sensory function is grossly intact  Head: Normocephalic and atraumatic.  Eyes: Conjunctivae are normal. Pupils are equal, round, and reactive to   light. No scleral icterus.  Neck: Normal range of motion. Neck supple. No tracheal deviation or thyromegaly present.  Cardiovascular:  SR without murmurs or gallops.  No carotid bruits Respiratory: Effort normal.  No respiratory distress. No chest wall tenderness. Breath sounds normal.  No wheezes, rales or rhonchi.  Abdomen:  neg GU: left groin crease there is a 2 cm raised mass with a punctum consistent with a sebaceous cyst Musculoskeletal: Normal range of motion. Extremities are nontender. No cyanosis, edema or clubbing noted Lymphadenopathy: No cervical, preauricular, postauricular or axillary adenopathy is present Skin: Skin is warm and dry. No rash noted. No diaphoresis. No erythema. No pallor. Pscyh: Normal mood and affect. Behavior is normal. Judgment and thought content normal.   LABORATORY  RESULTS: No results found for this or any previous visit (from the past 48 hour(s)).  RADIOLOGY RESULTS: No results found.  Problem List: Patient Active Problem List   Diagnosis Date Noted  . Alcohol withdrawal 03/31/2013  . Alcoholism 03/31/2013  . Sinus tachycardia 03/31/2013  . Nausea and vomiting 03/31/2013  . History of lung cancer 03/31/2013  . HYPERTENSION 02/05/2010  . CEREBRAL ANEURYSM 02/05/2010  . Chronic airway obstruction, not elsewhere classified 02/05/2010  . PULMONARY NODULE, RIGHT UPPER LOBE 02/05/2010  . OTHER AND UNSPECIFIED OVARIAN CYST 02/05/2010    Assessment & Plan: Sebaceous cyst of left groin    Matt B. Dori Devino, MD, FACS  [facility] [phone] beeper [phone]  05/16/2013 12:04 PM     

## 2013-06-11 ENCOUNTER — Encounter (HOSPITAL_BASED_OUTPATIENT_CLINIC_OR_DEPARTMENT_OTHER): Payer: Self-pay | Admitting: Surgery

## 2013-06-11 ENCOUNTER — Other Ambulatory Visit (HOSPITAL_COMMUNITY): Payer: BC Managed Care – PPO

## 2013-06-13 ENCOUNTER — Other Ambulatory Visit (HOSPITAL_COMMUNITY): Payer: BC Managed Care – PPO

## 2013-06-28 ENCOUNTER — Ambulatory Visit (INDEPENDENT_AMBULATORY_CARE_PROVIDER_SITE_OTHER): Payer: BC Managed Care – PPO | Admitting: Surgery

## 2013-06-28 ENCOUNTER — Encounter (INDEPENDENT_AMBULATORY_CARE_PROVIDER_SITE_OTHER): Payer: Self-pay | Admitting: Surgery

## 2013-06-28 VITALS — BP 98/66 | HR 68 | Temp 97.8°F | Resp 14 | Ht 63.5 in | Wt 130.6 lb

## 2013-06-28 DIAGNOSIS — L723 Sebaceous cyst: Secondary | ICD-10-CM

## 2013-06-28 NOTE — Progress Notes (Signed)
Claire Hamilton 57 y.o.  Body mass index is 22.77 kg/(m^2).  Patient Active Problem List   Diagnosis Date Noted  . Sebaceous cyst 05/16/2013  . Alcohol withdrawal 03/31/2013  . Alcoholism 03/31/2013  . Sinus tachycardia 03/31/2013  . Nausea and vomiting 03/31/2013  . History of lung cancer 03/31/2013  . HYPERTENSION 02/05/2010  . CEREBRAL ANEURYSM 02/05/2010  . Chronic airway obstruction, not elsewhere classified 02/05/2010  . PULMONARY NODULE, RIGHT UPPER LOBE 02/05/2010  . OTHER AND UNSPECIFIED OVARIAN CYST 02/05/2010    Allergies  Allergen Reactions  . Amoxicillin Nausea Only  . Codeine Nausea And Vomiting  . Hydrocodone Nausea And Vomiting  . Naproxen Nausea And Vomiting  . Latex Rash    SKIN IRRITATION    Past Surgical History  Procedure Laterality Date  . Appendectomy    . Brain surgery  2012    aneurism  . Hernia repair Left   . Small intestine surgery  1997  . Pneumonectomy  2011    cancerous tumor removal  . Cervical fusion  2000    C5-C7  . Mass excision Left 06/08/2013    Procedure: Excision of sebaceous cyst ;  Surgeon: Valarie Merino, MD;  Location:  SURGERY CENTER;  Service: General;  Laterality: Left;   Kaleen Mask, MD No diagnosis found.  Incision is healing.  Path showed sebaceous cyst.  Doing well. Return PRN.  Matt B. Daphine Deutscher, MD, [facility] [phone] beeper [phone]  06/28/2013 4:54 PM

## 2013-06-28 NOTE — Patient Instructions (Addendum)
Thanks for your patience.  If you need further assistance after leaving the office, please call our office and speak with a [facility] nurse.  [phone].  If you want to leave a message for Dr. Daphine Deutscher, please call his office phone at [phone].  Epidermal Cyst An epidermal cyst is sometimes called a sebaceous cyst, epidermal inclusion cyst, or infundibular cyst. These cysts usually contain a substance that looks "pasty" or "cheesy" and may have a bad smell. This substance is a protein called keratin. Epidermal cysts are usually found on the face, neck, or trunk. They may also occur in the vaginal area or other parts of the genitalia of both men and women. Epidermal cysts are usually small, painless, slow-growing bumps or lumps that move freely under the skin. It is important not to try to pop them. This may cause an infection and lead to tenderness and swelling. CAUSES  Epidermal cysts may be caused by a deep penetrating injury to the skin or a plugged hair follicle, often associated with acne. SYMPTOMS  Epidermal cysts can become inflamed and cause:  Redness.  Tenderness.  Increased temperature of the skin over the bumps or lumps.  Grayish-white, bad smelling material that drains from the bump or lump. DIAGNOSIS  Epidermal cysts are easily diagnosed by your caregiver during an exam. Rarely, a tissue sample (biopsy) may be taken to rule out other conditions that may resemble epidermal cysts. TREATMENT   Epidermal cysts often get better and disappear on their own. They are rarely ever cancerous.  If a cyst becomes infected, it may become inflamed and tender. This may require opening and draining the cyst. Treatment with antibiotics may be necessary. When the infection is gone, the cyst may be removed with minor surgery.  Small, inflamed cysts can often be treated with antibiotics or by injecting steroid medicines.  Sometimes, epidermal cysts become large and bothersome. If this happens,  surgical removal in your caregiver's office may be necessary. HOME CARE INSTRUCTIONS  Only take over-the-counter or prescription medicines as directed by your caregiver.  Take your antibiotics as directed. Finish them even if you start to feel better. SEEK MEDICAL CARE IF:   Your cyst becomes tender, red, or swollen.  Your condition is not improving or is getting worse.  You have any other questions or concerns. MAKE SURE YOU:  Understand these instructions.  Will watch your condition.  Will get help right away if you are not doing well or get worse. Document Released: 06/12/2004 Document Revised: 10/04/2011 Document Reviewed: 01/18/2011 [facility] Patient Information 2014 [place], [place].

## 2013-07-03 ENCOUNTER — Encounter: Payer: Self-pay | Admitting: Psychiatry

## 2013-08-16 ENCOUNTER — Other Ambulatory Visit: Payer: Self-pay | Admitting: Dermatology

## 2014-01-03 ENCOUNTER — Other Ambulatory Visit: Payer: Self-pay | Admitting: Obstetrics and Gynecology

## 2014-01-03 ENCOUNTER — Other Ambulatory Visit (HOSPITAL_COMMUNITY)
Admission: RE | Admit: 2014-01-03 | Discharge: 2014-01-03 | Disposition: A | Discharge status: Home / Self Care | Payer: BC Managed Care – PPO | Source: Ambulatory Visit | Attending: Obstetrics and Gynecology | Admitting: Obstetrics and Gynecology

## 2014-01-03 DIAGNOSIS — Z01419 Encounter for gynecological examination (general) (routine) without abnormal findings: Secondary | ICD-10-CM | POA: Insufficient documentation

## 2014-01-03 DIAGNOSIS — Z1151 Encounter for screening for human papillomavirus (HPV): Secondary | ICD-10-CM | POA: Insufficient documentation

## 2014-01-07 LAB — CYTOLOGY - PAP

## 2014-05-30 ENCOUNTER — Ambulatory Visit (INDEPENDENT_AMBULATORY_CARE_PROVIDER_SITE_OTHER): Payer: BC Managed Care – PPO | Admitting: Podiatry

## 2014-05-30 ENCOUNTER — Ambulatory Visit (INDEPENDENT_AMBULATORY_CARE_PROVIDER_SITE_OTHER): Payer: BC Managed Care – PPO

## 2014-05-30 ENCOUNTER — Encounter: Payer: Self-pay | Admitting: Podiatry

## 2014-05-30 VITALS — BP 102/55 | HR 54 | Resp 16

## 2014-05-30 DIAGNOSIS — M21612 Bunion of left foot: Secondary | ICD-10-CM

## 2014-05-30 DIAGNOSIS — M2042 Other hammer toe(s) (acquired), left foot: Secondary | ICD-10-CM

## 2014-05-30 DIAGNOSIS — M2012 Hallux valgus (acquired), left foot: Secondary | ICD-10-CM

## 2014-05-30 NOTE — Patient Instructions (Addendum)
Bunion (Hallux Valgus) A bony bump (protrusion) on the inside of the foot, at the base of the first toe, is called a bunion (hallux valgus). A bunion causes the first toe to angle toward the other toes. SYMPTOMS   A bony bump on the inside of the foot, causing an outward turning of the first toe. It may also overlap the second toe.  Thickening of the skin (callus) over the bony bump.  Fluid buildup under the callus. Fluid may become red, tender, and swollen (inflamed) with constant irritation or pressure.  Foot pain and stiffness. CAUSES  Many causes exist, including:  Inherited from your family (genetics).  Injury (trauma) forcing the first toe into a position in which it overlaps other toes.  Bunions are also associated with wearing shoes that have a narrow toe box (pointy shoes). RISK INCREASES WITH:  Family history of foot abnormalities, especially bunions.  Arthritis.  Narrow shoes, especially high heels. PREVENTION  Wear shoes with a wide toe box.  Avoid shoes with high heels.  Wear a small pad between the big toe and second toe.  Maintain proper conditioning:  Foot and ankle flexibility.  Muscle strength and endurance. PROGNOSIS  With proper treatment, bunions can typically be cured. Occasionally, surgery is required.  RELATED COMPLICATIONS   Infection of the bunion.  Arthritis of the first toe.  Risks of surgery, including infection, bleeding, injury to nerves (numb toe), recurrent bunion, overcorrection (toe points inward), arthritis of the big toe, big toe pointing upward, and bone not healing. TREATMENT  Treatment first consists of stopping the activities that aggravate the pain, taking pain medicines, and icing to reduce inflammation and pain. Wear shoes with a wide toe box. Shoes can be modified by a shoe repair person to relieve pressure on the bunion, especially if you cannot find shoes with a wide enough toe box. You may also place a pad with the  center cut out in your shoe, to reduce pressure on the bunion. Sometimes, an arch support (orthotic) may reduce pressure on the bunion and alleviate the symptoms. Stretching and strengthening exercises for the muscles of the foot may be useful. You may choose to wear a brace or pad at night to hold the big toe away from the second toe. If non-surgical treatments are not successful, surgery may be needed. Surgery involves removing the overgrown tissue and correcting the position of the first toe, by realigning the bones. Bunion surgery is typically performed on an outpatient basis, meaning you can go home the same day as surgery. The surgery may involve cutting the mid portion of the bone of the first toe, or just cutting and repairing (reconstructing) the ligaments and soft tissues around the first toe.  MEDICATION   If pain medicine is needed, nonsteroidal anti-inflammatory medicines, such as aspirin and ibuprofen, or other minor pain relievers, such as acetaminophen, are often recommended.  Do not take pain medicine for 7 days before surgery.  Prescription pain relievers are usually only prescribed after surgery. Use only as directed and only as much as you need.  Ointments applied to the skin may be helpful. HEAT AND COLD  Cold treatment (icing) relieves pain and reduces inflammation. Cold treatment should be applied for 10 to 15 minutes every 2 to 3 hours for inflammation and pain and immediately after any activity that aggravates your symptoms. Use ice packs or an ice massage.  Heat treatment may be used prior to performing the stretching and strengthening activities prescribed by your   caregiver, physical therapist, or athletic trainer. Use a heat pack or a warm soak. SEEK MEDICAL CARE IF:   Symptoms get worse or do not improve in 2 weeks, despite treatment.  After surgery, you develop fever, increasing pain, redness, swelling, drainage of fluids, bleeding, or increasing warmth around the  surgical area.  New, unexplained symptoms develop. (Drugs used in treatment may produce side effects.) Document Released: 07/12/2005 Document Revised: 10/04/2011 Document Reviewed: 10/24/2008 [employer] Patient Information 2015 [employer]. This information is not intended to replace advice given to you by your health care provider. Make sure you discuss any questions you have with your health care provider.   Hammer Toes Hammer toes is a condition in which one or more of your toes is permanently flexed. CAUSES  This happens when a muscle imbalance or abnormal bone length makes your small toes buckle. This causes the toe joint to contract and the strong cord-like bands that attach muscles to the bones (tendons) in your toes to shorten.  SIGNS AND SYMPTOMS  Common symptoms of flexible hammer toes include:   A buildup of skin cells (corns). Corns occur where boney bumps come in frequent contact with hard surfaces. For example, where your shoes press and rub.  Irritation.  Inflammation.  Pain.  Limited motion in your toes. DIAGNOSIS  Hammer toes are diagnosed through a physical exam of your toes. During the exam, your health care provider may try to reproduce your symptoms by manipulating your foot. Often, X-ray exams are done to determine the degree of deformity and to make sure that the cause is not a fracture.  TREATMENT  Hammer toes can be treated with corrective surgery. There are several types of surgical procedures that can treat hammer toes. The most common procedures include:  Arthroplasty--A portion of the joint is surgically removed and your toe is straightened. The gap fills in with fibrous tissue. This procedure helps treat pain and deformity and helps restore function.  Fusion--Cartilage between the two bones of the affected joint is taken out and the bones fuse together into one longer bone. This helps keep your toe stable and reduces pain but leaves your toe stiff, yet  straight.  Implantation--A portion of your bone is removed and replaced with an implant to restore motion.  Flexor tendon transfers--This procedure repositions the tendons that curl the toes down (flexor tendons). This may be done to release the deforming force that causes your toe to buckle. Several of these procedures require fixing your toe with a pin that is visible at the tip of your toe. The pin keeps the toe straight during healing. Your health care provider will remove the pin usually within 4-8 weeks after the procedure.  Document Released: 07/09/2000 Document Revised: 07/17/2013 Document Reviewed: 03/19/2013 [employer] Patient Information 2015 [employer]. This information is not intended to replace advice given to you by your health care provider. Make sure you discuss any questions you have with your health care provider.   

## 2014-05-30 NOTE — Progress Notes (Signed)
   Subjective:    Patient ID: Claire Hamilton, female    DOB: January 22, 1956, 57 y.o.   MRN: [id number]  HPI Comments: "I want this left foot checked"  Patient c/o aching 1st MPJ left for several years. Worsened within the last month. The area worse with shoes. She also has a corn 5th toe left.  Foot Pain      Review of Systems  HENT: Positive for hearing loss and sinus pressure.   All other systems reviewed and are negative.      Objective:   Physical Exam        Assessment & Plan:

## 2014-05-30 NOTE — Progress Notes (Signed)
Subjective:     Patient ID: Claire Hamilton, female   DOB: 07-Jul-1956, 58 y.o.   MRN: [id number]  HPIpatient states are quite a bit of discomfort around my big toe joint left and on the outside of the left fifth metatarsal and also on my left fifth toe. States that she has been trying wider shoes and has been trying padding without relief along with soaks. Patient also is due to have carpal tunnel surgery by her orthopedic doctor in the next few weeks   Review of Systems  All other systems reviewed and are negative.      Objective:   Physical Exam  Constitutional: She is oriented to person, place, and time.  Cardiovascular: Intact distal pulses.   Musculoskeletal: Normal range of motion.  Neurological: She is oriented to person, place, and time.  Skin: Skin is warm.  Nursing note and vitals reviewed. neurovascular status found to be intact with muscle strength adequate and range of motion subtalar midtarsal joint within normal limits. Digits are well-perfused and she is well oriented 3 with no equinus condition noted. 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 Discussed treatment options and she wants to have this fixed and I did discuss bunionectomy and other procedures including metatarsal osteotomy fifth left and arthroplasty fifth left correct problems. She will reappoint 2 weeks and should no her schedule with her other doctor so we can coordinate to do her surgery 1-2 weeks after her carpal tunnel surgery

## 2014-06-13 ENCOUNTER — Ambulatory Visit (INDEPENDENT_AMBULATORY_CARE_PROVIDER_SITE_OTHER): Payer: BC Managed Care – PPO | Admitting: Podiatry

## 2014-06-13 ENCOUNTER — Encounter: Payer: Self-pay | Admitting: Podiatry

## 2014-06-13 VITALS — BP 104/61 | HR 59 | Resp 12

## 2014-06-13 DIAGNOSIS — M2042 Other hammer toe(s) (acquired), left foot: Secondary | ICD-10-CM

## 2014-06-13 DIAGNOSIS — M21612 Bunion of left foot: Secondary | ICD-10-CM

## 2014-06-13 DIAGNOSIS — M2012 Hallux valgus (acquired), left foot: Secondary | ICD-10-CM

## 2014-06-13 NOTE — Progress Notes (Signed)
Subjective:     Patient ID: Claire Hamilton, female   DOB: [birth year], 58 y.o.   MRN: [id number]  HPI patient presents stating I'm ready for surgery on my left foot but would like to wait until after Thanksgiving. Patient states the foot has been very sore and she's not can and do her carpal tunnel at this time as the foot bothers her more   Review of Systems     Objective:   Physical Exam Neurovascular status intact with large hyperostosis medial aspect first metatarsal head left lateral side fifth metatarsal head left and fifth digit left with pain when palpated and redness noted.    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 Right foot also has deformity but not to the same degree    Plan:     H&P performed and conditions discussed. I have recommended surgery and allow patient to read consent form for [facility] bunionectomy with pin and made osteotomy with screw fixation fifth left and arthroplasty fifth toe left foot. Explained all risk of procedures and the fact there is no long-term guarantees as far as success of surgery. Patient wants surgery signed consent form and is given all preoperative instructions for procedures

## 2014-07-01 ENCOUNTER — Telehealth: Payer: Self-pay | Admitting: *Deleted

## 2014-07-01 NOTE — Telephone Encounter (Signed)
I called the patient to see if she wanted to reschedule surgery because Caren Griffins said she rescheduled to 08/13/2013.  "Are you just now finding out?  I called them a week ago.  I asked if I needed to call you and the girl I spoke to said she would let you know.  I am so sorry."  I informed her it's okay.  I told her he is not in the office that week.  "The girl I spoke to said he had surgery scheduled for that day already."  I told her that patient had already canceled for that day.  "Well what do you have available?  I had already made arrangements with my job for that day.  I would prefer tomorrow but my family asked me to wait until after the holidays."  I offered her 08/20/2014.  "Okay, I'll put it down for that date.  Again I am so sorry for not calling you.  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  I should handle matters like this myself."  I told her it is okay, it didn't cause any problems at all.

## 2014-07-01 NOTE — Telephone Encounter (Signed)
Per Caren Griffins, the patient rescheduled to 08/13/2014 because her family said this was a bad time for her to schedule surgery.  They asked her to wait until after holidays.

## 2014-07-02 ENCOUNTER — Other Ambulatory Visit: Payer: Self-pay

## 2014-07-02 DIAGNOSIS — Z1231 Encounter for screening mammogram for malignant neoplasm of breast: Secondary | ICD-10-CM

## 2014-07-02 NOTE — Telephone Encounter (Signed)
I have down that her surgery was rescheduled to 08/20/2014. So does that mean the patient rescheduled again to 08/13/2014.

## 2014-07-08 ENCOUNTER — Encounter: Payer: BC Managed Care – PPO | Admitting: Podiatry

## 2014-07-23 ENCOUNTER — Ambulatory Visit: Payer: BC Managed Care – PPO

## 2014-08-01 ENCOUNTER — Telehealth: Payer: Self-pay | Admitting: *Deleted

## 2014-08-01 NOTE — Telephone Encounter (Signed)
"  I was scheduled for surgery on the 19th but was called and told I couldn't do it on that day.  So they told me I could do it on the 26th.  I don't know if this has been scheduled or not.  I have not heard anything from anyone.  I don't know anything.  Has it been scheduled?"  Yes ma'am it is scheduled for the 26th.  "Well what time is it going to be?  I need more information than what I've been given.  Where will it be done at?  I have to let my job know, I need more detail."  Your surgery is going to be done at [facility].  You should have received a pamphlet with this information on it.  We cannot give you a definite time because there may be some changes in the schedule.  I can tell you tentatively right now you are scheduled at 9am, arrival will be at 8am.  They will call you a day or two ahead of time and give you the exact time.  "The only thing they gave me when I was at your office is a brush and this pamphlet."  That is the pamphlet from the surgical center.  "Okay, they said I need to go on-line and fill the information out.  They should already have that information.  He said I would be out of work for about 2 weeks and then I could go back to work if they had a job I could do sitting."  You can just call them and give them the information.  "Okay, I will.  Thanks for your help.

## 2014-08-07 ENCOUNTER — Encounter: Payer: Self-pay | Admitting: Podiatry

## 2014-08-07 ENCOUNTER — Ambulatory Visit (INDEPENDENT_AMBULATORY_CARE_PROVIDER_SITE_OTHER): Payer: BLUE CROSS/BLUE SHIELD | Admitting: Podiatry

## 2014-08-07 VITALS — BP 94/58 | HR 63 | Resp 11

## 2014-08-07 DIAGNOSIS — M2012 Hallux valgus (acquired), left foot: Secondary | ICD-10-CM

## 2014-08-07 DIAGNOSIS — M2042 Other hammer toe(s) (acquired), left foot: Secondary | ICD-10-CM

## 2014-08-07 DIAGNOSIS — M21612 Bunion of left foot: Secondary | ICD-10-CM

## 2014-08-07 NOTE — Progress Notes (Signed)
Subjective:     Patient ID: Claire Hamilton, female   DOB: 17-Jun-1956, 59 y.o.   MRN: [id number]  HPI patient presents stating I just wanted to go over again what is can be done to my foot. States the bunion has been hurting the bone on the outside is been hurting at her fifth toe is been hurting with lesion formation that does not get better with trimming or padding   Review of Systems     Objective:   Physical Exam Neurovascular status is found to be intact with muscle strength adequate and range of motion within normal limits. 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 Due to long-standing nature and failure to respond to wider shoes trimming and padding it's been recommended surgical intervention be considered. Patient wants to do surgery and she has already reviewed consent forms and I really reviewed again with her the procedures and showed them to her on x-ray. She is comfortable with this and wants procedures and will have this done on January 26

## 2014-08-13 ENCOUNTER — Telehealth: Payer: Self-pay | Admitting: *Deleted

## 2014-08-13 NOTE — Telephone Encounter (Signed)
I cancelled patients first surgery post-op appointment.

## 2014-08-13 NOTE — Telephone Encounter (Signed)
"  I just found out today I need major car work done.  I can't afford this and being out of work and paying a hospital bill.  I'm going through a terrible divorce.  It's nothing to do with the surgery.  It's just my finances, I can't afford it right now."  I told her I'd let Dr. Paulla Dolly know and I will call and cancel at the surgical center.  "Okay, I appreciate that."  I called and informed Caren Griffins at [facility].

## 2014-08-28 ENCOUNTER — Encounter: Payer: Self-pay | Admitting: Podiatry

## 2015-01-07 ENCOUNTER — Other Ambulatory Visit (HOSPITAL_COMMUNITY)
Admission: RE | Admit: 2015-01-07 | Discharge: 2015-01-07 | Disposition: A | Discharge status: Home / Self Care | Payer: BLUE CROSS/BLUE SHIELD | Source: Ambulatory Visit | Attending: Obstetrics and Gynecology | Admitting: Obstetrics and Gynecology

## 2015-01-07 ENCOUNTER — Other Ambulatory Visit: Payer: Self-pay | Admitting: Obstetrics and Gynecology

## 2015-01-07 DIAGNOSIS — Z01419 Encounter for gynecological examination (general) (routine) without abnormal findings: Secondary | ICD-10-CM | POA: Insufficient documentation

## 2015-01-09 LAB — CYTOLOGY - PAP

## 2016-02-16 ENCOUNTER — Other Ambulatory Visit (HOSPITAL_COMMUNITY)
Admission: RE | Admit: 2016-02-16 | Discharge: 2016-02-16 | Disposition: A | Discharge status: Home / Self Care | Payer: BLUE CROSS/BLUE SHIELD | Source: Ambulatory Visit | Attending: Obstetrics and Gynecology | Admitting: Obstetrics and Gynecology

## 2016-02-16 ENCOUNTER — Other Ambulatory Visit: Payer: Self-pay | Admitting: Obstetrics and Gynecology

## 2016-02-16 DIAGNOSIS — Z1151 Encounter for screening for human papillomavirus (HPV): Secondary | ICD-10-CM | POA: Insufficient documentation

## 2016-02-16 DIAGNOSIS — Z01419 Encounter for gynecological examination (general) (routine) without abnormal findings: Secondary | ICD-10-CM | POA: Diagnosis present

## 2016-02-17 LAB — CYTOLOGY - PAP

## 2016-05-31 ENCOUNTER — Other Ambulatory Visit: Payer: Self-pay | Admitting: Family Medicine

## 2016-05-31 ENCOUNTER — Ambulatory Visit
Admission: RE | Admit: 2016-05-31 | Discharge: 2016-05-31 | Disposition: A | Discharge status: Home / Self Care | Payer: BLUE CROSS/BLUE SHIELD | Source: Ambulatory Visit | Attending: Family Medicine | Admitting: Family Medicine

## 2016-05-31 DIAGNOSIS — R0600 Dyspnea, unspecified: Secondary | ICD-10-CM

## 2016-05-31 DIAGNOSIS — R0781 Pleurodynia: Secondary | ICD-10-CM

## 2017-09-19 IMAGING — CR DG CHEST 2V
2 series · 2 of 2 positions shown · non-contrast
Comparison: 01/13/2010.

CLINICAL DATA: Right-sided pleuritic chest pain.

EXAM:
CHEST  2 VIEW

[w chest pa]
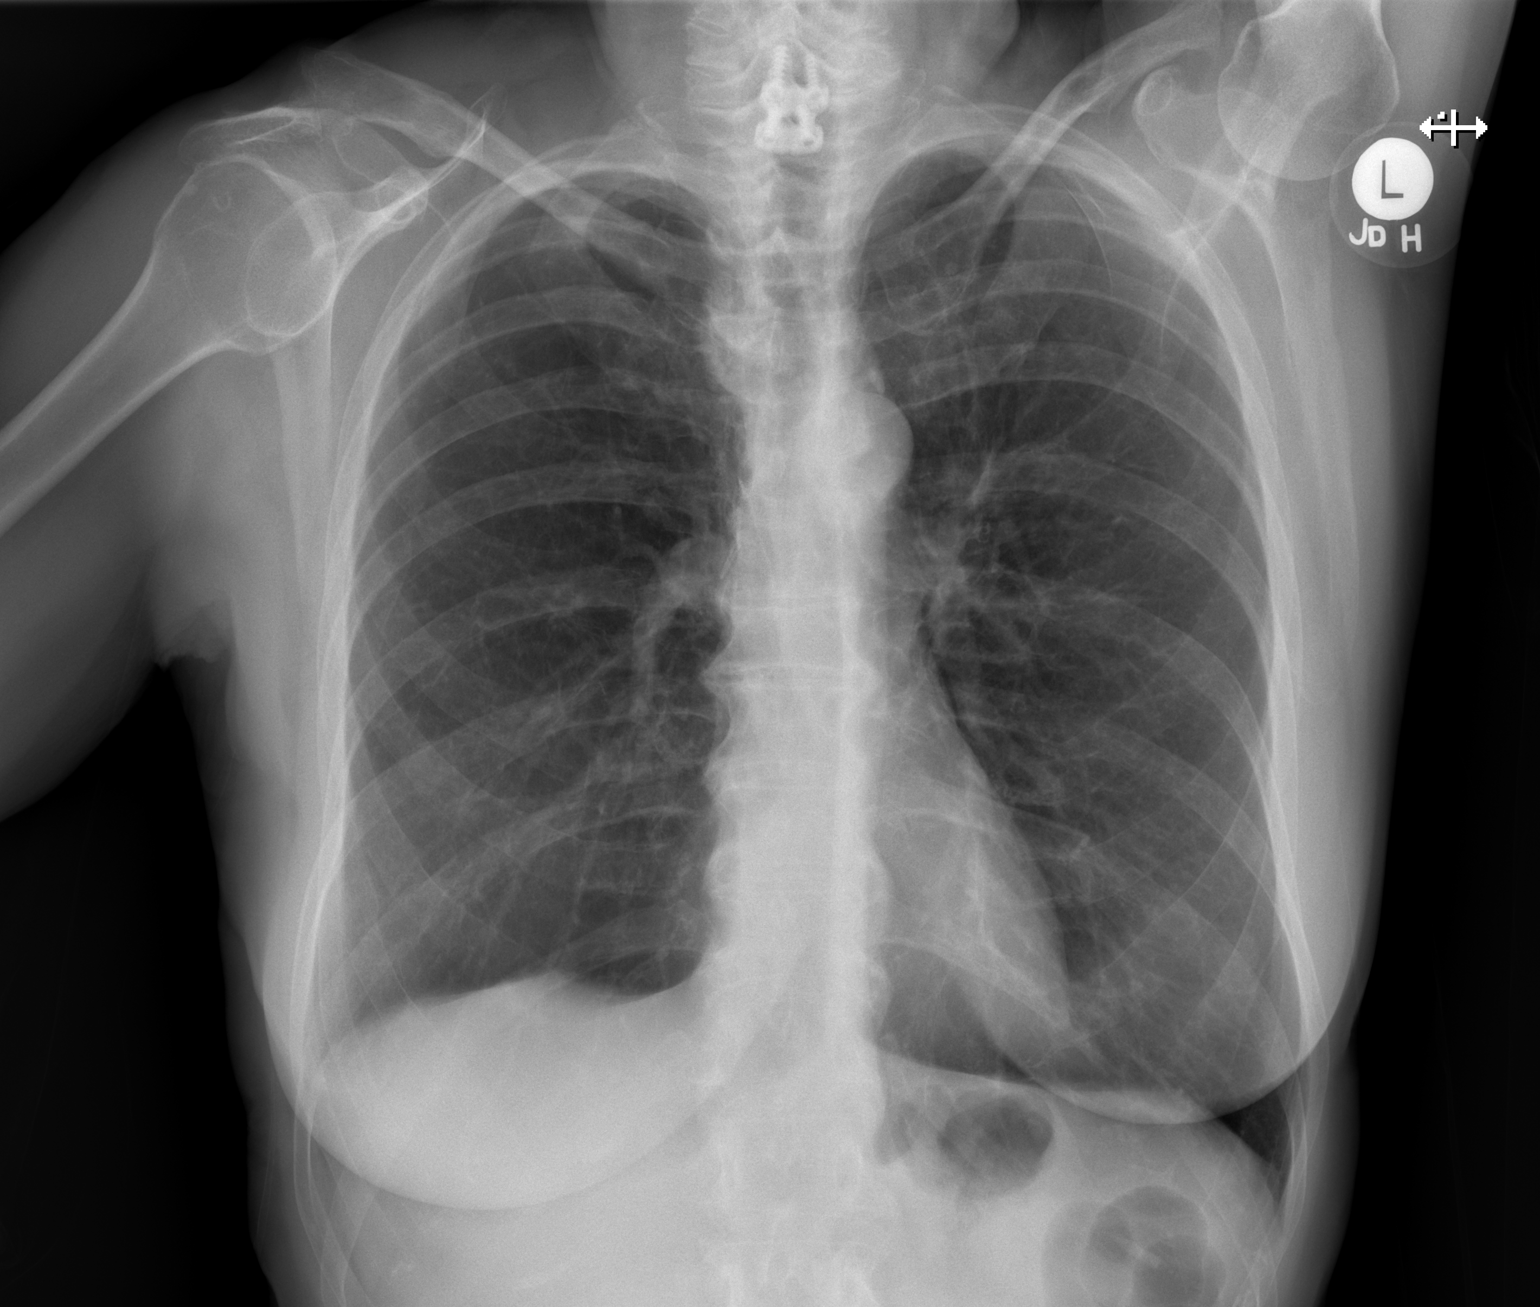

[w chest lat]
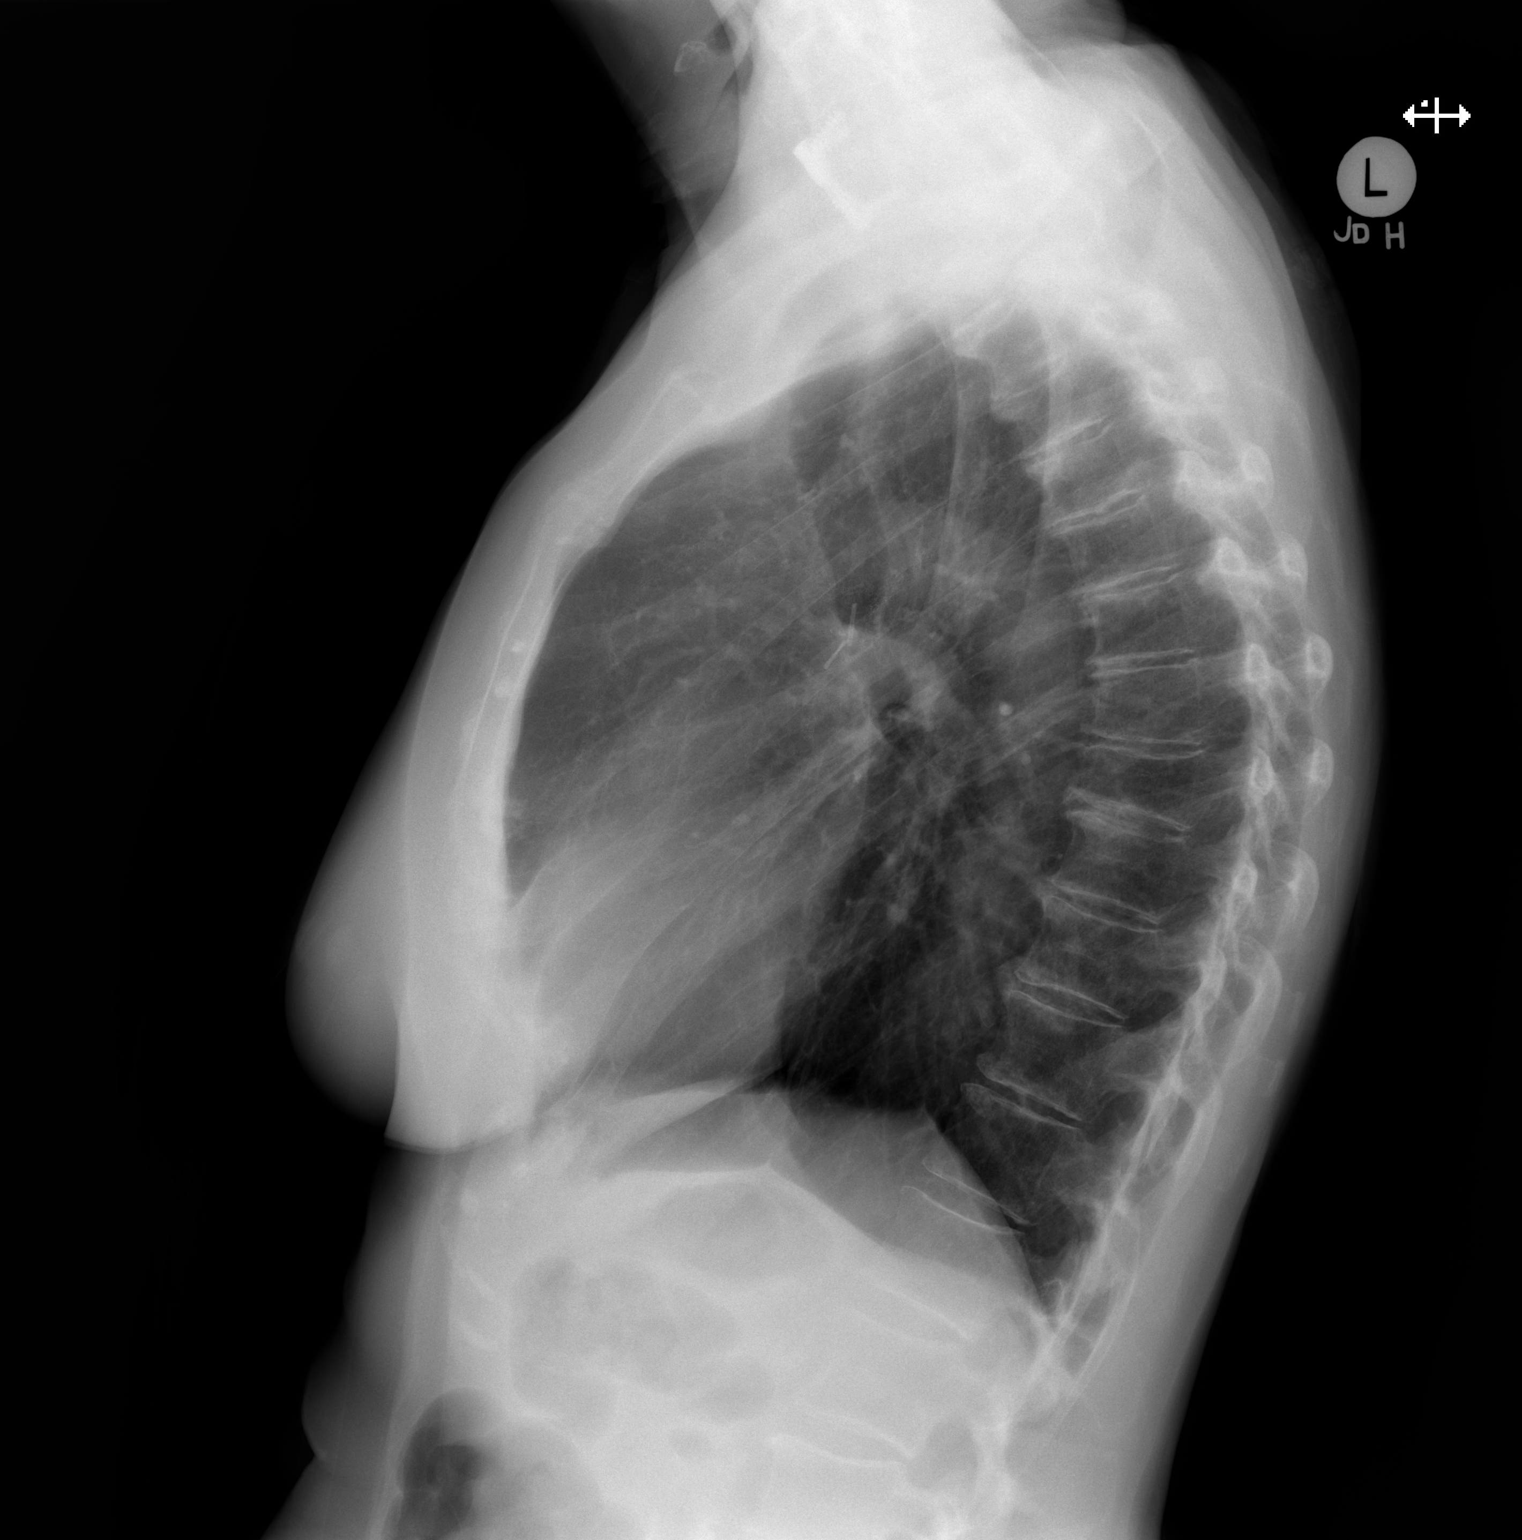

[2 of 2 positions shown; findings below may reference images not displayed]

FINDINGS: Mediastinum hilar structures normal. Surgical clips right hilar
region. Lungs are clear. Small right pleural effusion versus pleural
scarring. No pneumothorax. Left nipple shadows noted. Diffuse
degenerative change cervical thoracic spine. Prior cervical thoracic
spine fusion .
IMPRESSION: Surgical clips right hilar region. Small right pleural effusion
versus pleural scarring. No other significant abnormality
identified.

## 2017-10-25 ENCOUNTER — Other Ambulatory Visit (HOSPITAL_COMMUNITY): Payer: Self-pay | Admitting: Family Medicine

## 2017-10-25 ENCOUNTER — Ambulatory Visit
Admission: RE | Admit: 2017-10-25 | Discharge: 2017-10-25 | Disposition: A | Discharge status: Home / Self Care | Payer: BLUE CROSS/BLUE SHIELD | Source: Ambulatory Visit | Attending: Family Medicine | Admitting: Family Medicine

## 2017-10-25 DIAGNOSIS — M7989 Other specified soft tissue disorders: Secondary | ICD-10-CM | POA: Insufficient documentation

## 2017-10-25 DIAGNOSIS — M79605 Pain in left leg: Secondary | ICD-10-CM

## 2018-12-12 IMAGING — US US EXTREM LOW VENOUS*L*
1 series · 13 of 24 positions shown · non-contrast
Comparison: None.

CLINICAL DATA: Left leg pain



[Series 1: us extrem low venous*left* · 0.07mm/px · 13 of 32 slices shown]
[im 1/32]
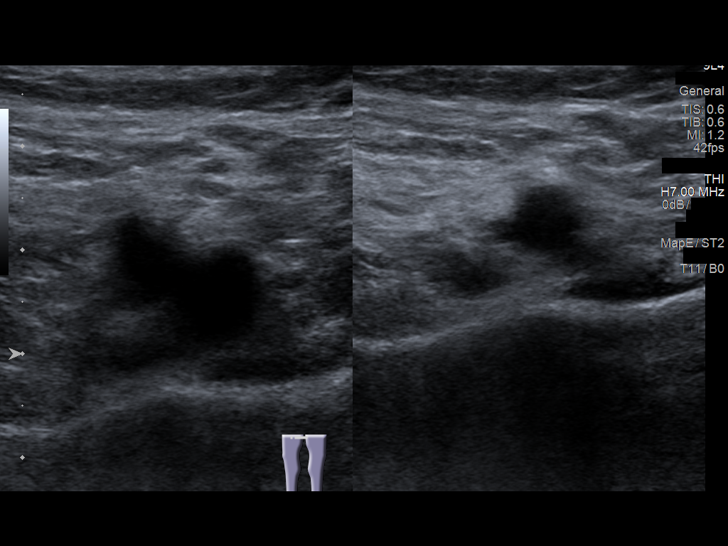
[im 3/32]
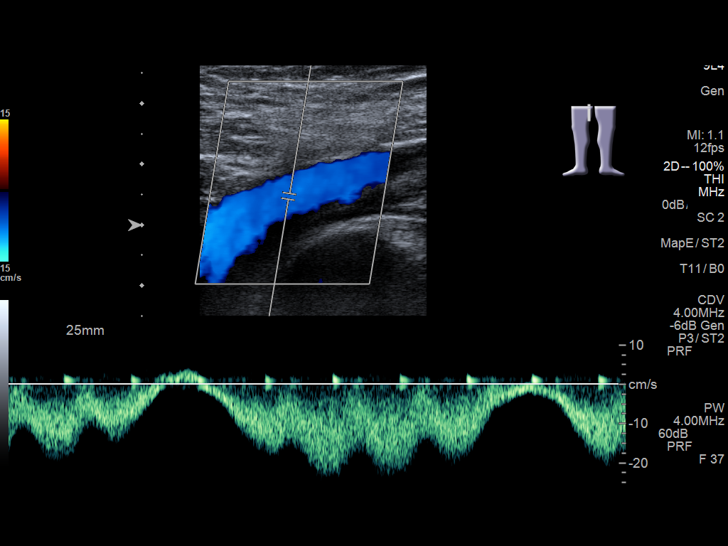
[im 6/32]
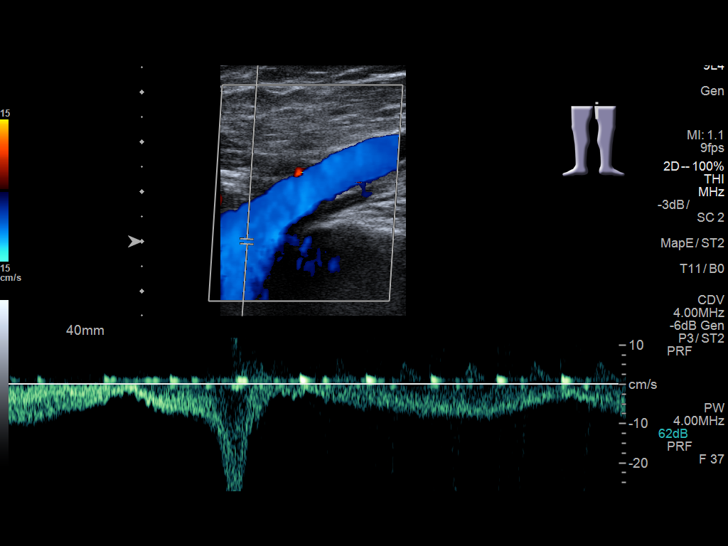
[im 9/32]
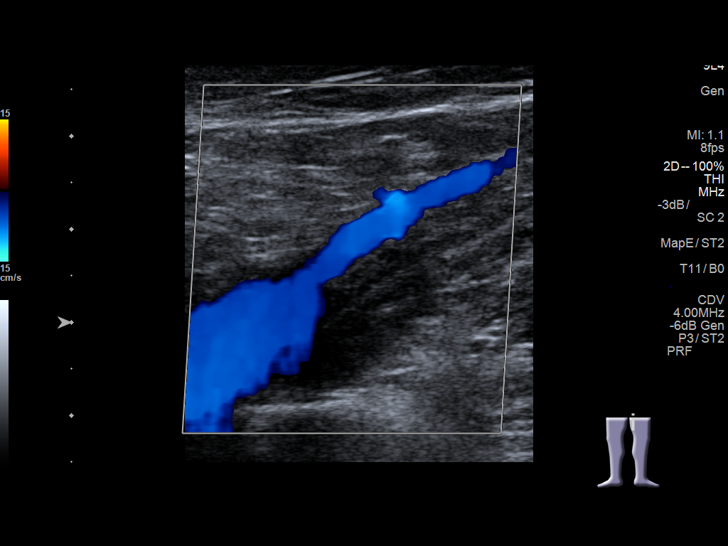
[im 11/32]
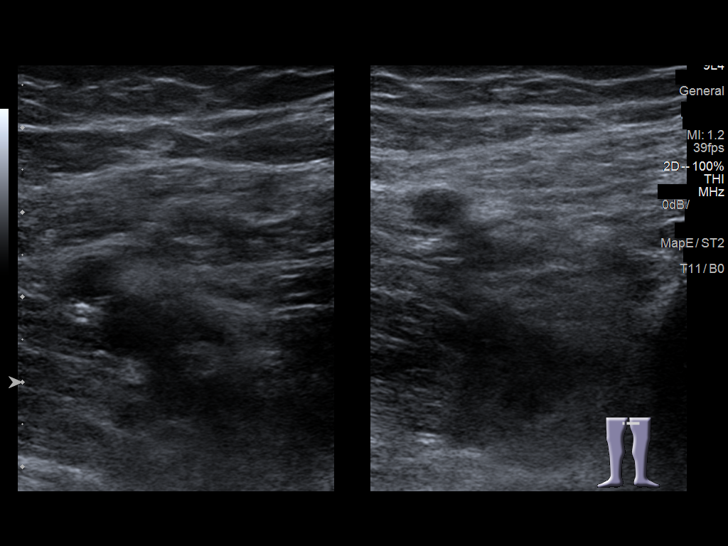
[im 14/32]
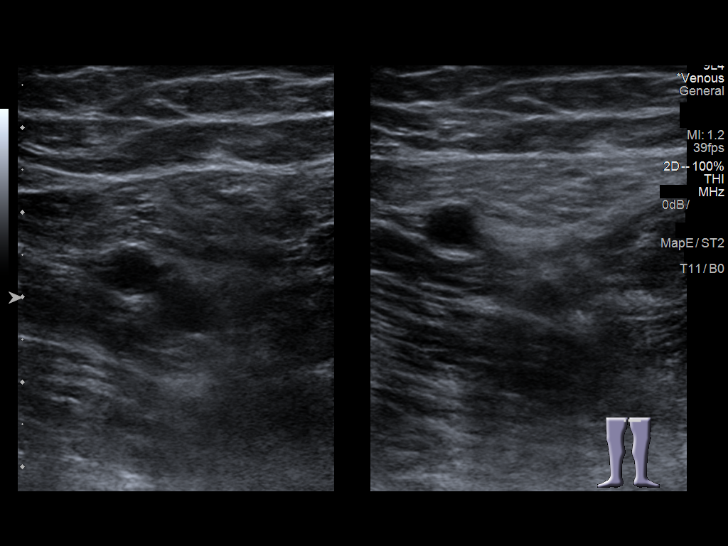
[im 17/32]
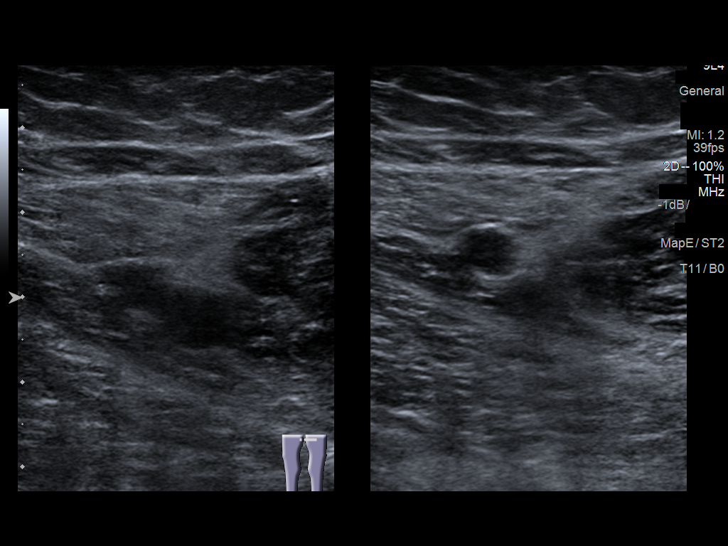
[im 18/32]
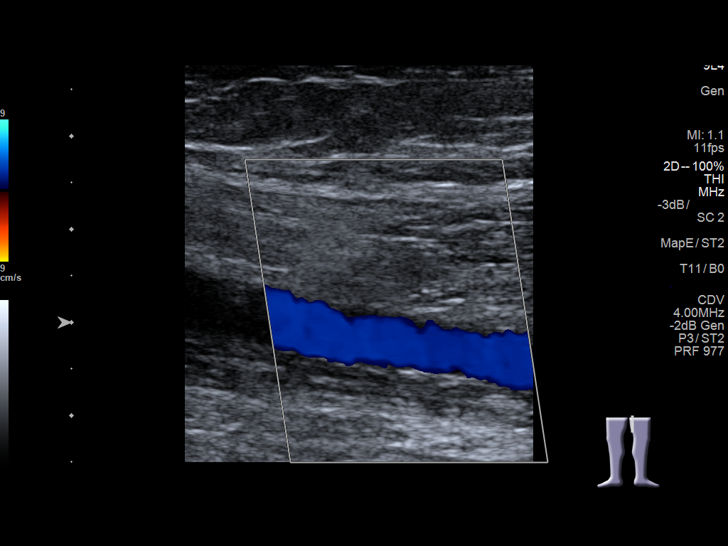
[im 21/32]
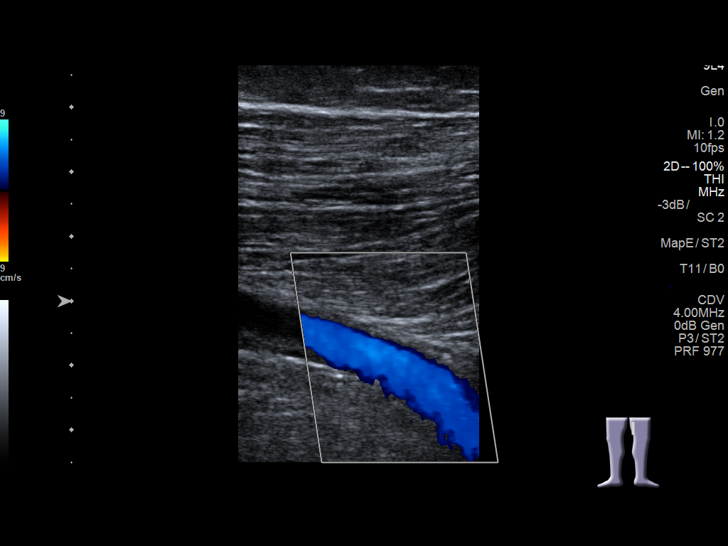
[im 23/32]
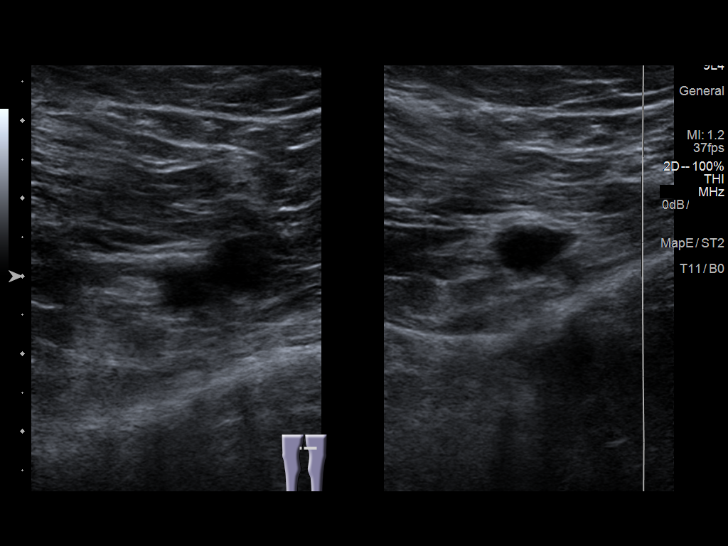
[im 26/32]
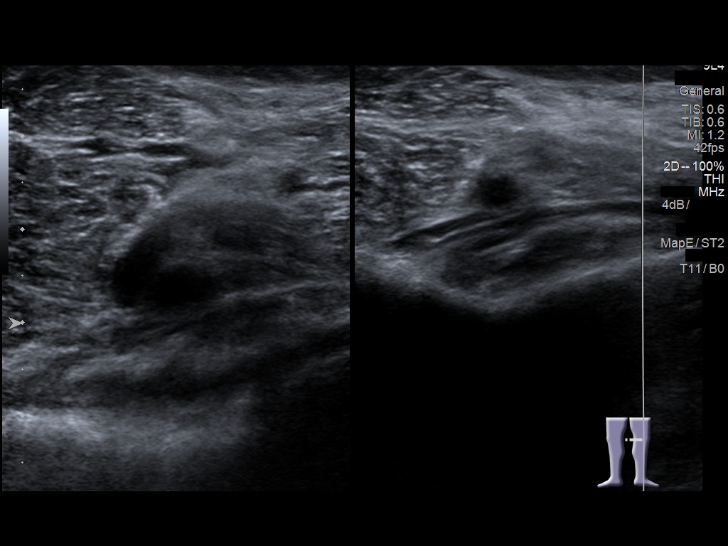
[im 29/32]
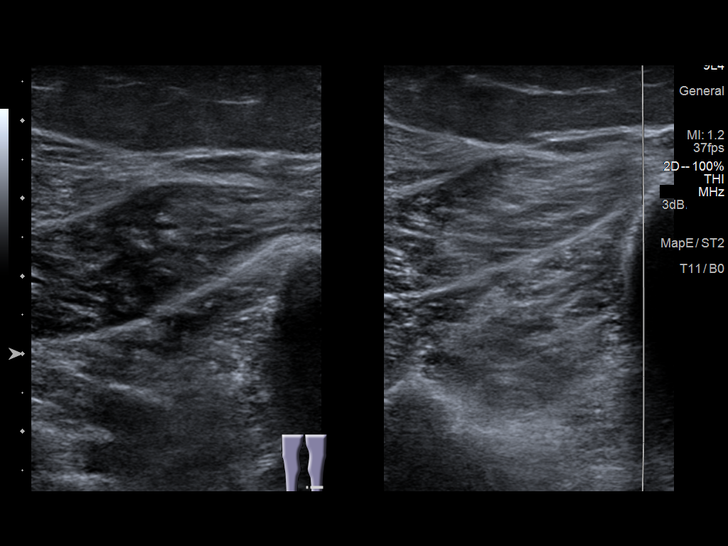
[im 32/32]
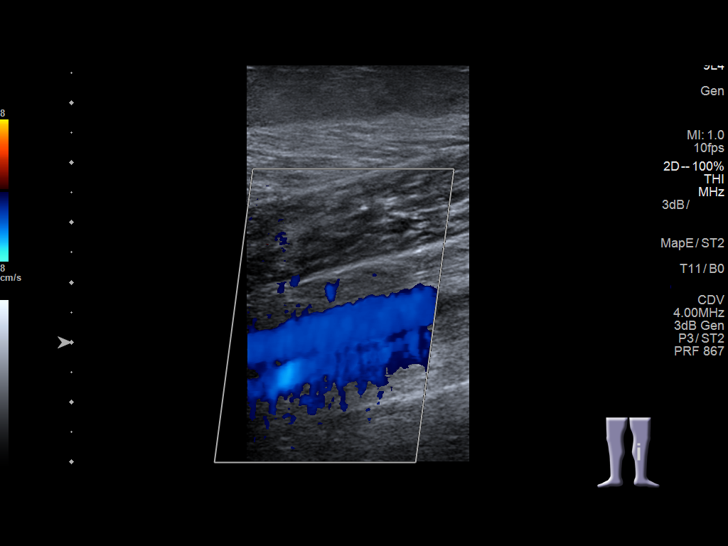

[13 of 24 positions shown; findings below may reference images not displayed]

FINDINGS: Contralateral Common Femoral Vein: Respiratory phasicity is normal
and symmetric with the symptomatic side. No evidence of thrombus.
Normal compressibility.

Common Femoral Vein: No evidence of thrombus. Normal
compressibility, respiratory phasicity and response to augmentation.

Saphenofemoral Junction: No evidence of thrombus. Normal
compressibility and flow on color Doppler imaging.

Profunda Femoral Vein: No evidence of thrombus. Normal
compressibility and flow on color Doppler imaging.

Femoral Vein: No evidence of thrombus. Normal compressibility,
respiratory phasicity and response to augmentation.

Popliteal Vein: No evidence of thrombus. Normal compressibility,
respiratory phasicity and response to augmentation.

Calf Veins: No evidence of thrombus. Normal compressibility and flow
on color Doppler imaging.

Superficial Great Saphenous Vein: No evidence of thrombus. Normal
compressibility.

Venous Reflux:  None.

Other Findings:  None.
IMPRESSION: No evidence of deep venous thrombosis.
